# Patient Record
Sex: Male | Born: 1975 | Race: White | Hispanic: No | Marital: Married | Smoking: Former smoker
Health system: Southern US, Community
[De-identification: ages and names within clinical notes are randomized; demographics above are authoritative.]

## PROBLEM LIST (undated history)

## (undated) DIAGNOSIS — N200 Calculus of kidney: Secondary | ICD-10-CM

## (undated) HISTORY — DX: Calculus of kidney: N20.0

---

## 2008-08-09 ENCOUNTER — Emergency Department (HOSPITAL_COMMUNITY): Admission: EM | Admit: 2008-08-09 | Discharge: 2008-08-10 | Payer: Self-pay | Admitting: Emergency Medicine

## 2009-08-14 ENCOUNTER — Ambulatory Visit: Payer: Self-pay | Admitting: Internal Medicine

## 2009-08-14 DIAGNOSIS — J069 Acute upper respiratory infection, unspecified: Secondary | ICD-10-CM | POA: Insufficient documentation

## 2009-10-24 IMAGING — US US SCROTUM
1 series · 13 of 25 positions shown · non-contrast
Comparison: None.

CLINICAL DATA: 33-year-old male with scrotal pain.  "Cyst ruptured
on top of left testicle."

SCROTAL ULTRASOUND
DOPPLER ULTRASOUND OF THE TESTICLES
TECHNIQUE: Complete ultrasound examination of the testicles,
epididymis, and other scrotal structures was performed.  Color and
spectral Doppler ultrasound were also utilized to evaluate blood
flow to the testicles.

[Series 1: unknown · 0.09mm/px · 13 of 64 slices shown]
[im 1/64]
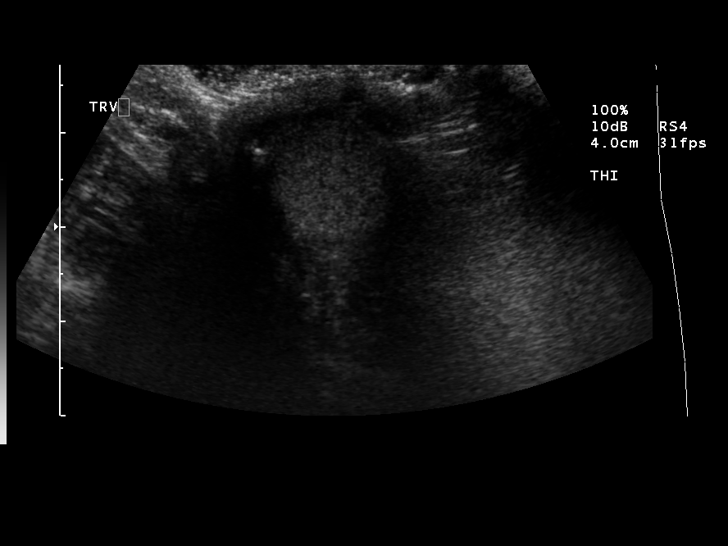
[im 6/64]
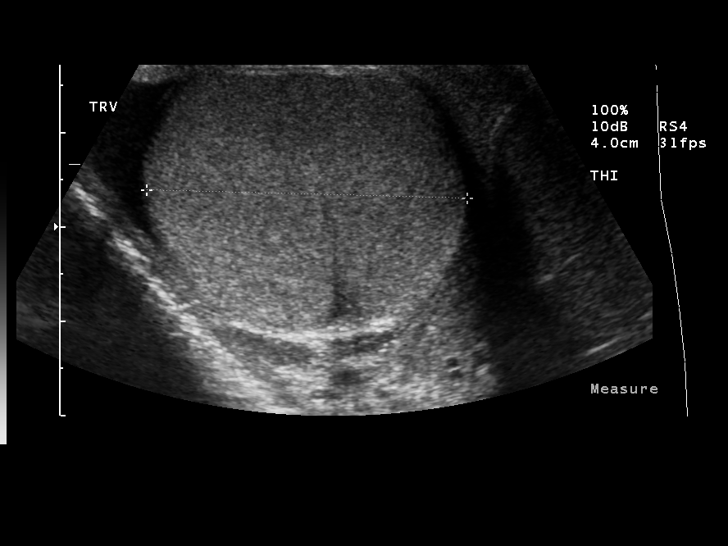
[im 11/64]
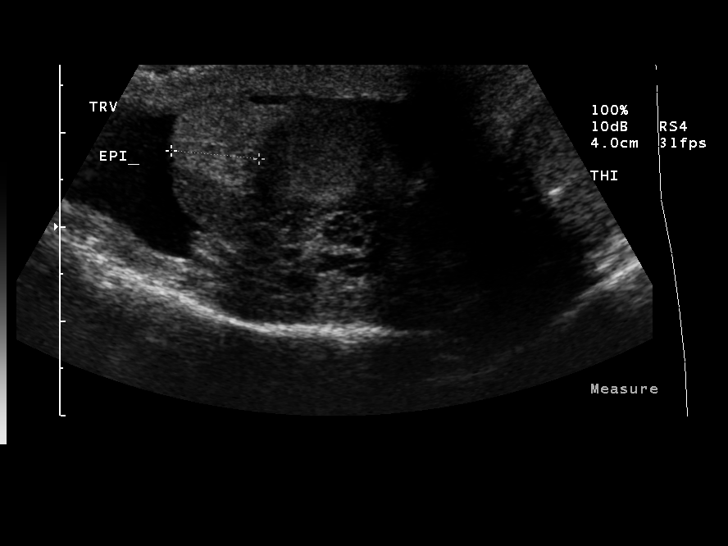
[im 16/64]
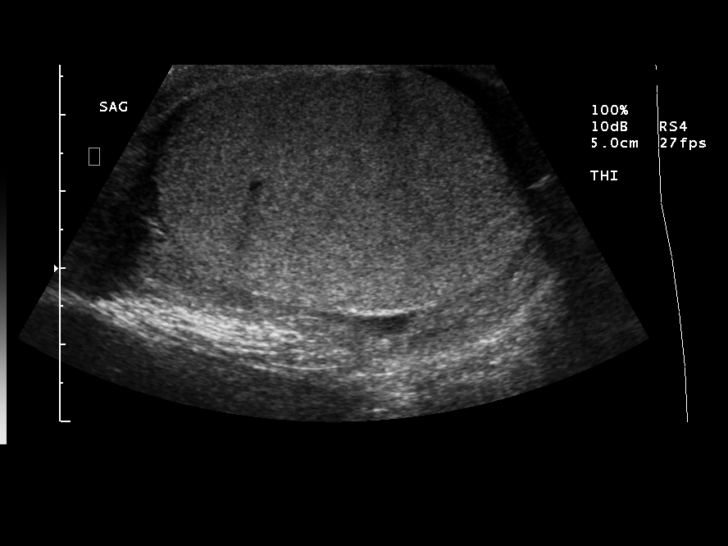
[im 22/64]
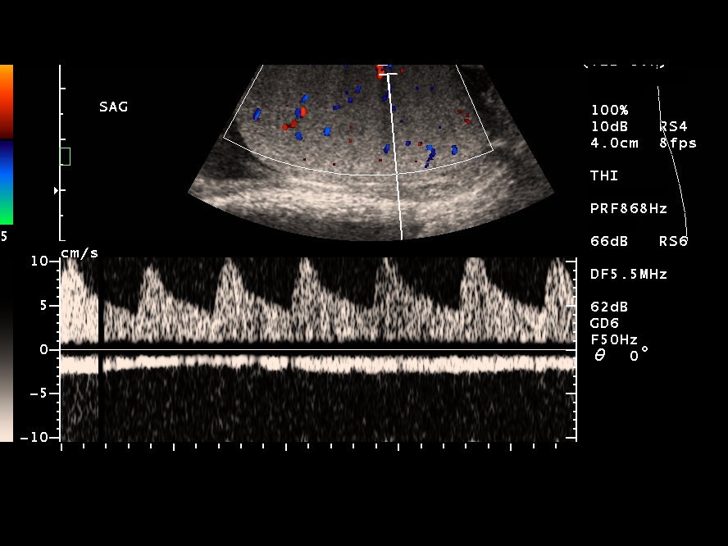
[im 27/64]
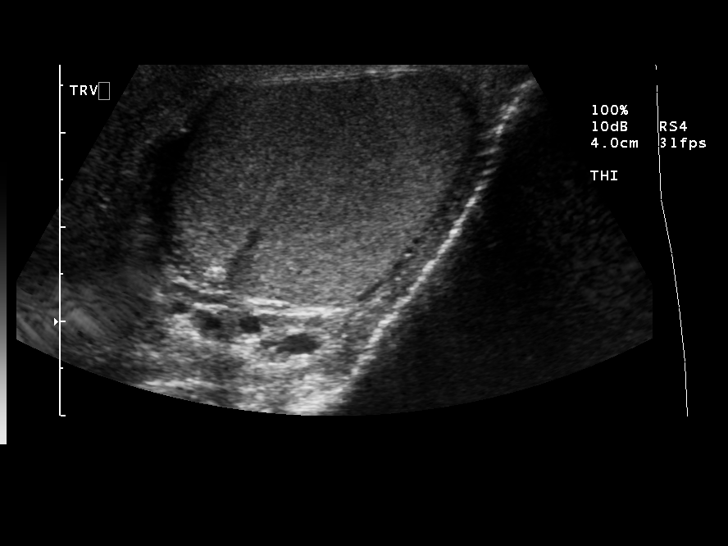
[im 32/64]
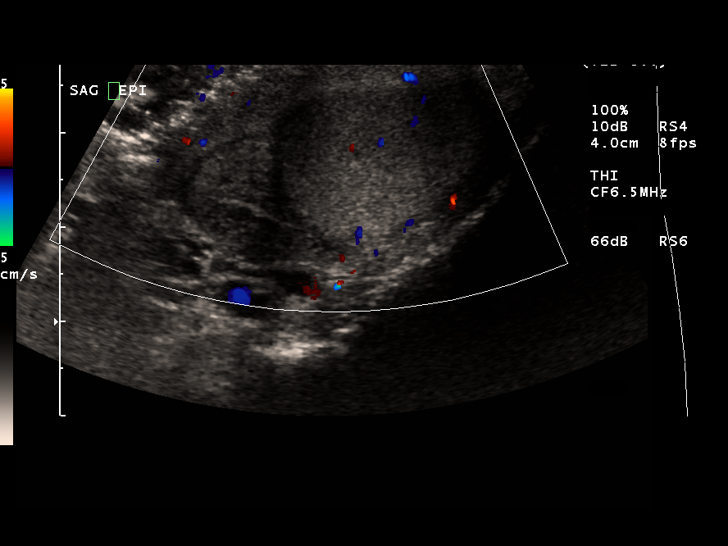
[im 37/64]
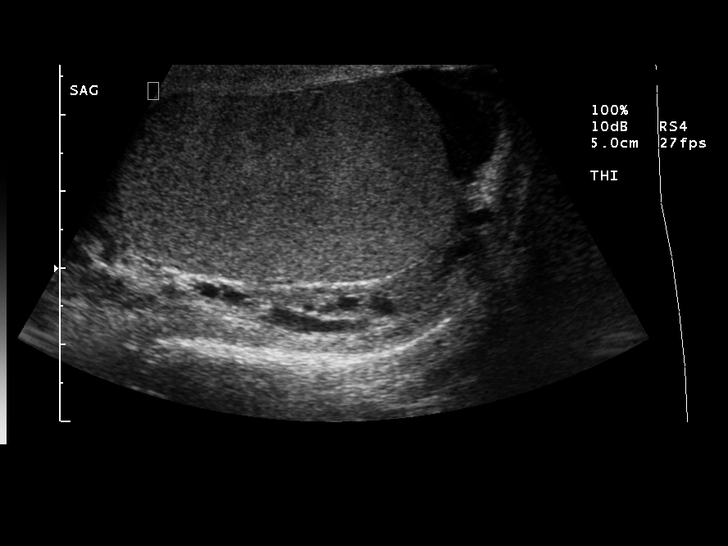
[im 43/64]
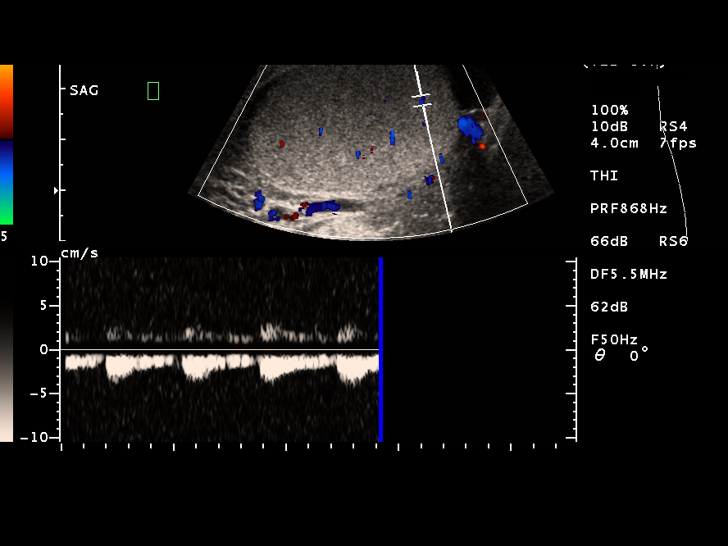
[im 48/64]
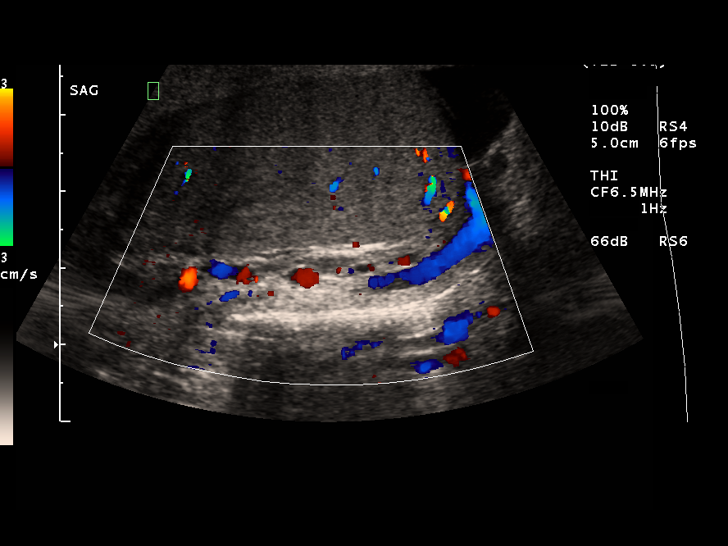
[im 53/64]
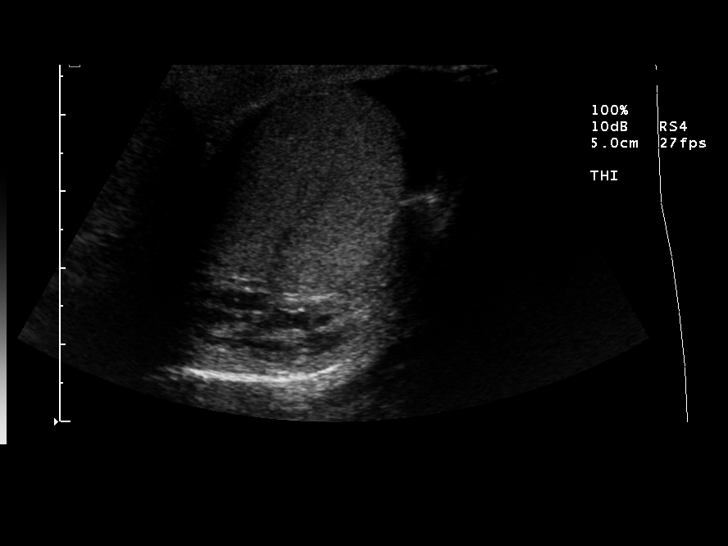
[im 58/64]
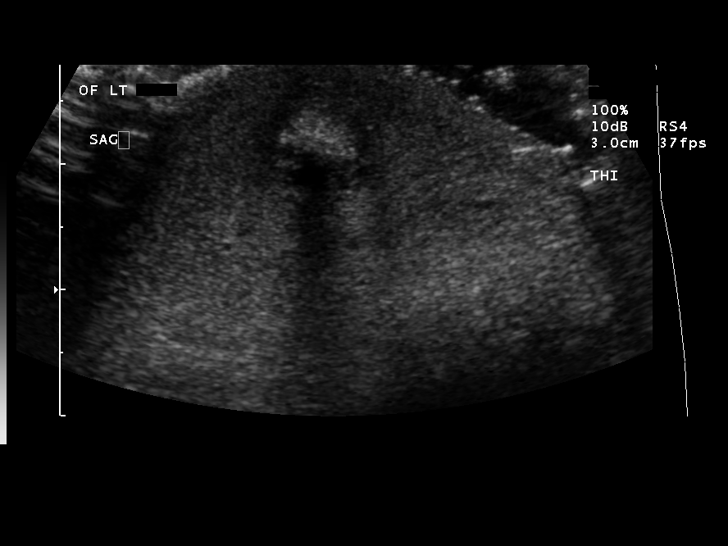
[im 64/64]
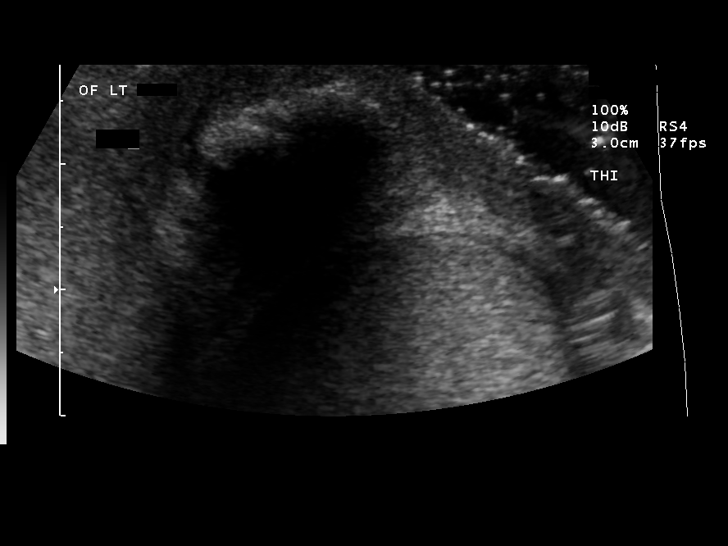

[13 of 25 positions shown; findings below may reference images not displayed]

FINDINGS: Right testicle has normal echotexture measures 5.2 x
x 3.4 cm.  The right epididymis is normal measuring 11 x 6 x 9 mm.
Trace hydrocele on the right appears simple.  Color Doppler reveals
normal vascularity in the right testicle with arterial and venous
waveforms detected.

The left testicle has normal echotexture and measures 4.7 x 2.5 x
2.9 cm.  The left epididymis is within normal limits measuring 8 x
13 x 9 mm.  Vascularity of the left testicle and epididymis are
symmetric to that on the right with arterial and venous waveforms
detected.  Trace hydrocele on the left, similar to that on the
right.

Superficial to the left testicle labeled "area of cyst on top" and
there is an area of dirty shadowing measuring 17 mm with
heterogeneously increased echotexture.  The appearance suggests a
gas or calcification.  This appears to be associated with the
scrotal wall which is thickened.
IMPRESSION: 1.  Normal testicles and no evidence of testicular torsion.
2. Left scrotal wall thickening with an area of increased
echogenicity and dirty shadowing measuring 17 mm labeled as "area
of cyst."  Recommend correlation with physical exam.

## 2010-08-17 NOTE — Assessment & Plan Note (Signed)
Summary: BRAND NEW PT/TO EST/FEVER/COUGH/BODY ACHES/CJR   Vital Signs:  Patient profile:   35 year old male Height:      69 inches Weight:      153 pounds BMI:     22.68 Temp:     98.5 degrees F oral BP sitting:   88 / 44  (left arm) Cuff size:   regular  Vitals Entered By: Raechel Ache, RN (August 14, 2009 1:21 PM) CC: New to establish. C/o fever, sore throat, aches and skin hurts, productive cough, head congestion and diarrhea.   CC:  New to establish. C/o fever, sore throat, aches and skin hurts, productive cough, and head congestion and diarrhea..  History of Present Illness: 35 year old patient who is seen today to establish with our practice.  He presents with a 3-day history of fever, chills, myalgias, and slightly productive cough.  His has had head  congestion, mild sore throat, and some episodic diarrhea.  denies any chest pain or shortness of breath.  His past medical history is unremarkable.  He is seen today to establish  with our practice and for a preventative exam  Preventive Screening-Counseling & Management  Caffeine-Diet-Exercise     Does Patient Exercise: yes  Allergies (verified): No Known Drug Allergies  Past History:  Past Medical History: unremarkable  Past Surgical History: outpatient treatment of a fracture to the right hand (, fourth and fifth metacarpals)  Family History: Reviewed history and no changes required.  details is father's health unknown mother, age 59 in excellent health  No siblings  Social History: Reviewed history and no changes required. 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  Son, age 69 Regular exercise-yes health club 3 times weeklyDoes Patient Exercise:  yes  Review of Systems       The patient complains of anorexia, fever, and prolonged cough.  The patient denies weight loss, weight gain, vision loss, decreased hearing, hoarseness, chest pain, syncope, dyspnea on  exertion, peripheral edema, headaches, hemoptysis, abdominal pain, melena, hematochezia, severe indigestion/heartburn, hematuria, incontinence, genital sores, muscle weakness, suspicious skin lesions, transient blindness, difficulty walking, depression, unusual weight change, abnormal bleeding, enlarged lymph nodes, angioedema, breast masses, and testicular masses.    Physical Exam  General:  Well-developed,well-nourished,in no acute distress; alert,appropriate and cooperative throughout examination Head:  Normocephalic and atraumatic without obvious abnormalities. No apparent alopecia or balding. Eyes:  No corneal or conjunctival inflammation noted. EOMI. Perrla. Funduscopic exam benign, without hemorrhages, exudates or papilledema. Vision grossly normal. Ears:  External ear exam shows no significant lesions or deformities.  Otoscopic examination reveals clear canals, tympanic membranes are intact bilaterally without bulging, retraction, inflammation or discharge. Hearing is grossly normal bilaterally. Nose:  External nasal examination shows no deformity or inflammation. Nasal mucosa are pink and moist without lesions or exudates. Mouth:  pharyngeal erythema.   Neck:  No deformities, masses, or tenderness noted. Chest Wall:  No deformities, masses, tenderness or gynecomastia noted. Breasts:  No masses or gynecomastia noted Lungs:  Normal respiratory effort, chest expands symmetrically. Lungs are clear to auscultation, no crackles or wheezes. Heart:  Normal rate and regular rhythm. S1 and S2 normal without gallop, murmur, click, rub or other extra sounds. Abdomen:  Bowel sounds positive,abdomen soft and non-tender without masses, organomegaly or hernias noted. Genitalia:  Testes bilaterally descended without nodularity, tenderness or masses. No scrotal masses or lesions. No penis lesions or urethral discharge. Msk:  No deformity or scoliosis noted of thoracic or lumbar spine.  Pulses:  R and L  carotid,radial,femoral,dorsalis pedis and posterior tibial pulses are full and equal bilaterally Extremities:  No clubbing, cyanosis, edema, or deformity noted with normal full range of motion of all joints.   Neurologic:  alert & oriented X3, sensation intact to light touch, sensation intact to pinprick, and gait normal.   Skin:  Intact without suspicious lesions or rashes Cervical Nodes:  No lymphadenopathy noted Axillary Nodes:  No palpable lymphadenopathy Inguinal Nodes:  No significant adenopathy Psych:  Cognition and judgment appear intact. Alert and cooperative with normal attention span and concentration. No apparent delusions, illusions, hallucinations   Impression & Recommendations:  Problem # 1:  URI (ICD-465.9)  His updated medication list for this problem includes:    Hydrocodone-homatropine 5-1.5 Mg/63ml Syrp (Hydrocodone-homatropine) .Marland Kitchen... 1 teaspoon every 6 hours as needed for cough  Problem # 2:  Preventive Health Care (ICD-V70.0)  Complete Medication List: 1)  Hydrocodone-homatropine 5-1.5 Mg/47ml Syrp (Hydrocodone-homatropine) .Marland Kitchen.. 1 teaspoon every 6 hours as needed for cough  Patient Instructions: 1)  Get plenty of rest, drink lots of clear liquids, and use Tylenol or Ibuprofen for fever and comfort. Return in 7-10 days if you're not better:sooner if you're feeling worse. Prescriptions: HYDROCODONE-HOMATROPINE 5-1.5 MG/5ML SYRP (HYDROCODONE-HOMATROPINE) 1 teaspoon every 6 hours as needed for cough  #6 oz x 0   Entered and Authorized by:   Gordy Savers  MD   Signed by:   Gordy Savers  MD on 08/14/2009   Method used:   Print then Give to Patient   RxID:   [id number]

## 2010-08-17 NOTE — Letter (Signed)
Summary: Out of Work  Adult nurse at [employer]  [address]   [place], [place] [address]   Phone: [phone]  Fax: [phone]    August 14, 2009   Employee:  DAMEIAN CRISMAN    To Whom It May Concern:   For Medical reasons, please excuse the above named employee from work for the following dates:  Start:   08-14-2009  End:   08-15-2009  If you need additional information, please feel free to contact our office.         Sincerely,    Gordy Savers  MD

## 2010-11-01 LAB — URINALYSIS, ROUTINE W REFLEX MICROSCOPIC
Glucose, UA: NEGATIVE mg/dL
Hgb urine dipstick: NEGATIVE
Ketones, ur: NEGATIVE mg/dL
Protein, ur: NEGATIVE mg/dL

## 2012-01-02 ENCOUNTER — Ambulatory Visit (INDEPENDENT_AMBULATORY_CARE_PROVIDER_SITE_OTHER): Payer: BC Managed Care – PPO | Admitting: Internal Medicine

## 2012-01-02 ENCOUNTER — Encounter: Payer: Self-pay | Admitting: Internal Medicine

## 2012-01-02 VITALS — BP 120/80 | HR 64 | Temp 97.9°F | Ht 69.0 in | Wt 154.0 lb

## 2012-01-02 DIAGNOSIS — M722 Plantar fascial fibromatosis: Secondary | ICD-10-CM

## 2012-01-02 DIAGNOSIS — M7711 Lateral epicondylitis, right elbow: Secondary | ICD-10-CM

## 2012-01-02 DIAGNOSIS — M7710 Lateral epicondylitis, unspecified elbow: Secondary | ICD-10-CM

## 2012-01-02 NOTE — Assessment & Plan Note (Signed)
Patient experiencing progressively worsening bilateral foot pain consistent with plantar fasciitis. Reviewed stretching exercises and use of shoe inserts. Refer to podiatrist for further evaluation and treatment.

## 2012-01-02 NOTE — Assessment & Plan Note (Signed)
Patient works as Games developer. His symptoms consistent with lateral epicondylitis. Patient advised to avoid heavy lifting/gripping for next 1 to 2 weeks. Use ice compress twice per day and forearm brace. If persistent symptoms, we discussed possibly using cortisone injection.

## 2012-01-02 NOTE — Patient Instructions (Addendum)
Use right forearm brace No heavy lifting or gripping for 1-2 weeks Use ibuprofen 600 mg every 12 hrs as needed for 1-2 weeks Our office will contact you re: podiatry referral

## 2012-01-02 NOTE — Progress Notes (Signed)
  Subjective:    Patient ID: Brandon Gibbs, male    DOB: [birth year], 36 y.o.   MRN: [id number]  HPI  36 year old white male to establish. Patient complains of chronic right forearm pain for the last 2 months. He denies injury or trauma. 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 His symptoms are worse with gripping heavy objects and wrist extension.  He has been self treating with ibuprofen daily.  Patient also complains of chronic bilateral foot pain. He has pain at inside of his heel and towards the middle of his foot. Patient wears fairly comfortable shoes due to the fact that he is on his feet most of the day. He also works as a Engineer, water.  Bilateral foot pain is progressively worsening.   Review of Systems   Constitutional: Negative for activity change, appetite change and unexpected weight change.  Eyes: Negative for visual disturbance.  Respiratory: Negative for cough, chest tightness and shortness of breath.   Cardiovascular: Negative for chest pain.  Genitourinary: Negative for difficulty urinating.  Neurological: Negative for headaches.  Gastrointestinal: Negative for abdominal pain, heartburn melena or hematochezia Psych: Negative for depression or anxiety  Past Medical History  Diagnosis Date  . Kidney stones     History   Social History  . Marital Status: Married    Spouse Name: N/A    Number of Children: N/A  . Years of Education: N/A   Occupational History  . diesel Curator [employer]   Social History Main Topics  . Smoking status: Former Smoker    Types: Cigarettes    Quit date: 01/01/2006  . Smokeless tobacco: Not on file  . Alcohol Use: Yes  . Drug Use: No  . Sexually Active: Not on file   Other Topics Concern  . 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 He has 2 children daughter 42 and son 33    No past surgical history on file.  No family history on  file.  Allergies  Allergen Reactions  . Penicillins     No current outpatient prescriptions on file prior to visit.    BP 120/80  Pulse 64  Temp 97.9 F (36.6 C) (Oral)  Ht 5\' 9"  (1.753 m)  Wt 154 lb (69.854 kg)  BMI 22.74 kg/m2       Objective:   Physical Exam  Constitutional: He appears well-developed and well-nourished.  HENT:  Head: Normocephalic and atraumatic.  Right Ear: External ear normal.  Left Ear: External ear normal.  Mouth/Throat: Oropharynx is clear and moist.  Eyes: Conjunctivae and EOM are normal. Pupils are equal, round, and reactive to light.  Neck: Neck supple. Thyromegaly present.  Cardiovascular: Normal rate, regular rhythm and normal heart sounds.   No murmur heard. Pulmonary/Chest: Effort normal and breath sounds normal. He has no wheezes.  Abdominal: Soft. Bowel sounds are normal. He exhibits no mass. There is no tenderness.  Musculoskeletal:       Tenderness of right lateral epicondyle area Bilateral heel tenderness  Lymphadenopathy:    He has no cervical adenopathy.  Skin: Skin is warm and dry.  Psychiatric: He has a normal mood and affect.       Assessment & Plan:

## 2012-07-10 ENCOUNTER — Encounter: Payer: Self-pay | Admitting: Family

## 2012-07-10 ENCOUNTER — Ambulatory Visit (INDEPENDENT_AMBULATORY_CARE_PROVIDER_SITE_OTHER): Payer: BC Managed Care – PPO | Admitting: Family

## 2012-07-10 VITALS — BP 132/88 | Temp 98.2°F | Wt 156.0 lb

## 2012-07-10 DIAGNOSIS — Z7251 High risk heterosexual behavior: Secondary | ICD-10-CM

## 2012-07-10 DIAGNOSIS — R300 Dysuria: Secondary | ICD-10-CM

## 2012-07-10 LAB — POCT URINALYSIS DIPSTICK
Blood, UA: NEGATIVE
Ketones, UA: NEGATIVE
pH, UA: 6.5

## 2012-07-10 LAB — HIV ANTIBODY (ROUTINE TESTING W REFLEX): HIV: NONREACTIVE

## 2012-07-10 MED ORDER — AZITHROMYCIN 500 MG PO TABS: 1000.0000 mg | ORAL_TABLET | Freq: Every day | ORAL | Status: DC

## 2012-07-10 MED ORDER — CIPROFLOXACIN HCL 500 MG PO TABS: 500.0000 mg | ORAL_TABLET | Freq: Two times a day (BID) | ORAL | Status: DC

## 2012-07-10 NOTE — Patient Instructions (Signed)
Dysuria  Dysuria is the medical term for pain with urination. There are many causes for dysuria, but urinary tract infection is the most common. If a urinalysis was performed it can show that there is a urinary tract infection. A urine culture confirms that you or your child is sick. You will need to follow up with a healthcare provider because:  · If a urine culture was done you will need to know the culture results and treatment recommendations.  · If the urine culture was positive, you or your child will need to be put on antibiotics or know if the antibiotics prescribed are the right antibiotics for your urinary tract infection.  · If the urine culture is negative (no urinary tract infection), then other causes may need to be explored or antibiotics need to be stopped.  Today laboratory work may have been done and there does not seem to be an infection. If cultures were done they will take at least 24 to 48 hours to be completed.  Today x-rays may have been taken and they read as normal. No cause can be found for the problems. The x-rays may be re-read by a radiologist and you will be contacted if additional findings are made.  You or your child may have been put on medications to help with this problem until you can see your primary caregiver. If the problems get better, see your primary caregiver if the problems return. If you were given antibiotics (medications which kill germs), take all of the mediations as directed for the full course of treatment.   If laboratory work was done, you need to find the results. Leave a telephone number where you can be reached. If this is not possible, make sure you find out how you are to get test results.  HOME CARE INSTRUCTIONS   · Drink lots of fluids. For adults, drink eight, 8 ounce glasses of clear juice or water a day. For children, replace fluids as suggested by your caregiver.  · Empty the bladder often. Avoid holding urine for long periods of time.  · After a bowel  movement, women should cleanse front to back, using each tissue only once.  · Empty your bladder before and after sexual intercourse.  · Take all the medicine given to you until it is gone. You may feel better in a few days, but TAKE ALL MEDICINE.  · Avoid caffeine, tea, alcohol and carbonated beverages, because they tend to irritate the bladder.  · In men, alcohol may irritate the prostate.  · Only take over-the-counter or prescription medicines for pain, discomfort, or fever as directed by your caregiver.  · If your caregiver has given you a follow-up appointment, it is very important to keep that appointment. Not keeping the appointment could result in a chronic or permanent injury, pain, and disability. If there is any problem keeping the appointment, you must call back to this facility for assistance.  SEEK IMMEDIATE MEDICAL CARE IF:   · Back pain develops.  · A fever develops.  · There is nausea (feeling sick to your stomach) or vomiting (throwing up).  · Problems are no better with medications or are getting worse.  MAKE SURE YOU:   · Understand these instructions.  · Will watch your condition.  · Will get help right away if you are not doing well or get worse.  Document Released: 04/01/2004 Document Revised: 09/26/2011 Document Reviewed: 02/07/2008  ExitCare® Patient Information ©2013 [employer].

## 2012-07-10 NOTE — Progress Notes (Signed)
  Subjective:    Patient ID: Brandon Gibbs, male    DOB: Nov 13, 1975, 36 y.o.   MRN: [id number]  HPI 36 year old white male, patient of Dr. Artist Pais is in today with complaints of painful urination x2 days. He is sexually active approximately 5 current sexual partner, both protected and a protective. Denies any discharge from his penis. Denies any abdominal pain or back pain. No fever or chills. Reports a new sex partner this past week and that was unprotected.   Review of Systems  Constitutional: Negative.   Respiratory: Negative.   Cardiovascular: Negative.   Gastrointestinal: Negative.   Genitourinary: Positive for dysuria and penile pain. Negative for hematuria, discharge and testicular pain.  Musculoskeletal: Negative.   Skin: Negative.   Psychiatric/Behavioral: Negative.    Past Medical History  Diagnosis Date  . Kidney stones     History   Social History  . Marital Status: Married    Spouse Name: N/A    Number of Children: N/A  . Years of Education: N/A   Occupational History  . diesel Curator [employer]   Social History Main Topics  . Smoking status: Former Smoker    Types: Cigarettes    Quit date: 01/01/2006  . Smokeless tobacco: Not on file  . Alcohol Use: Yes  . Drug Use: No  . Sexually Active: Not on file   Other Topics Concern  . 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  No family history on file.  Allergies  Allergen Reactions  . Penicillins     No current outpatient prescriptions on file prior to visit.    BP 132/88  Temp 98.2 F (36.8 C) (Oral)  Wt 156 lb (70.761 kg)chart    Objective:   Physical Exam  Constitutional: He is oriented to person, place, and time. He appears well-developed and well-nourished.  Neck: Normal range of motion. Neck supple.  Cardiovascular: Normal rate, regular  rhythm and normal heart sounds.   Pulmonary/Chest: Effort normal and breath sounds normal.  Abdominal: Soft. Bowel sounds are normal.  Genitourinary: Penile tenderness present.       Small amount of yellow discharge from the penis. No LA.   Musculoskeletal: Normal range of motion.  Neurological: He is alert and oriented to person, place, and time.  Skin: Skin is warm and dry.  Psychiatric: He has a normal mood and affect.          Assessment & Plan:  Assessment: Dysuria, high-risk sexual behavior  Plan: We'll cover appears clean with azithromycin 1 g by mouth x1. Cipro 500 mg twice a day times one day. Abstain from sexual intercourse x1 week. In the future, protected sex. Additional lab sent to include HIV, HSV, RPR will notify patient pending results. GC chlamydia sent.

## 2012-07-10 NOTE — Addendum Note (Signed)
Addended by: Bonnye Fava on: 07/10/2012 01:25 PM   Modules accepted: Orders

## 2012-07-11 LAB — RPR

## 2012-07-12 LAB — GC/CHLAMYDIA PROBE AMP
CT Probe RNA: NEGATIVE
GC Probe RNA: NEGATIVE

## 2013-05-29 ENCOUNTER — Encounter: Payer: Self-pay | Admitting: Internal Medicine

## 2013-05-29 ENCOUNTER — Ambulatory Visit (INDEPENDENT_AMBULATORY_CARE_PROVIDER_SITE_OTHER): Payer: BC Managed Care – PPO | Admitting: Internal Medicine

## 2013-05-29 VITALS — BP 120/80 | HR 67 | Temp 97.9°F | Resp 20 | Wt 173.0 lb

## 2013-05-29 DIAGNOSIS — R369 Urethral discharge, unspecified: Secondary | ICD-10-CM

## 2013-05-29 DIAGNOSIS — Z23 Encounter for immunization: Secondary | ICD-10-CM

## 2013-05-29 MED ORDER — AZITHROMYCIN 250 MG PO TABS: ORAL_TABLET | ORAL | Status: DC

## 2013-05-29 MED ORDER — CEFTRIAXONE SODIUM 1 G IJ SOLR
250.0000 mg | Freq: Once | INTRAMUSCULAR | Status: AC
  Administered 2013-05-29: 250 mg via INTRAMUSCULAR

## 2013-05-29 NOTE — Progress Notes (Signed)
  Subjective:    Patient ID: Brandon Gibbs, male    DOB: [birth year], 37 y.o.   MRN: [id number]  HPI Pre-visit discussion using our clinic review tool. No additional management support is needed unless otherwise documented below in the visit note.  37 year old patient who presents with a two-day history of burning dysuria and urethral discharge. He is sexually active with multiple partners. He states that he uses condoms for vaginal intercourse but not for oral sex. No history of documented STD in the past although he had similar symptoms 11 months ago and had a negative STD screen. He presented with similar complaints and was treated with Cipro and azithromycin  Past Medical History  Diagnosis Date  . Kidney stones     History   Social History  . Marital Status: Married    Spouse Name: N/A    Number of Children: N/A  . Years of Education: N/A   Occupational History  . diesel Curator [employer]   Social History Main Topics  . Smoking status: Former Smoker    Types: Cigarettes    Quit date: 01/01/2006  . Smokeless tobacco: Not on file  . Alcohol Use: Yes  . Drug Use: No  . Sexual Activity: Not on file   Other Topics Concern  . 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 He has 2 children daughter 52 and son 36    History reviewed. No pertinent past surgical history.  No family history on file.  Allergies  Allergen Reactions  . Penicillins     No current outpatient prescriptions on file prior to visit.   No current facility-administered medications on file prior to visit.    BP 120/80  Pulse 67  Temp(Src) 97.9 F (36.6 C) (Oral)  Resp 20  Wt 173 lb (78.472 kg)  SpO2 98%       Review of Systems  Genitourinary: Positive for dysuria and discharge.       Objective:   Physical Exam  Constitutional: He appears well-developed and well-nourished. No distress.  Genitourinary: Penis  normal.          Assessment & Plan:   Urethral discharge.  We'll screen for GC Chlamydia and HIV. We'll treat empirically with Rocephin 250 mg IM and azithromycin 1 g by mouth  STD information provided

## 2013-05-29 NOTE — Patient Instructions (Signed)
Gonorrhea, Females and Males  Gonorrhea is an infection. Gonorrhea can be treated with medicines that kill germs (antibiotics). It is necessary that all your sexual partners also be tested for infection and possibly be treated.   CAUSES   Gonorrhea is caused by a germ (bacteria) called Neisseria gonorrhoeae. This infection is spread by sexual contact. The contact that spreads gonorrhea from person to person may be oral, anal, or genital sex.  SYMPTOMS   Females  A woman may have gonorrhea infection and no symptoms. The most common symptoms are:   Pain in the lower abdomen.   Fever, with or without chills.  When these are the most serious problems, the illness is commonly called pelvic inflammatory disease (PID). Other symptoms include:   Abnormal vaginal discharge.   Painful intercourse.   Burning or itching of the vagina or lips of the vagina.   Abnormal vaginal bleeding.   Pain when urinating.  If the infection is spread by anal sex:   Irritation, pain, bleeding, or discharge from the rectum.  If the infection is spread by oral sex with either a man or a woman:   Sore throat, fever, and swollen neck lymph glands.  Other problems may include:   Long-lasting (chronic) pain in the lower abdomen during menstruation, intercourse, or at other times.   Inability to become pregnant.   Premature birth.   Passing the infection onto a newborn baby. This can cause an eye infection in the infant or more serious health problems.  Males  Less frequently than in women, men may have gonorrhea infection and no symptoms. The most common symptoms are:   Discharge from the penis.   Pain or burning during urination.  If the infection is spread by anal sex:   Irritation, pain, bleeding, or discharge from the rectum.  If the infection is spread by oral sex with either a man or a woman:   Sore throat, fever, and swollen neck lymph glands.  DIAGNOSIS   Diagnosis is made by exam of the patient and checking a sample of  discharge under a microscope for the presence of the bacteria. Discharge may be taken from the urethra, cervix, throat, or rectum.  TREATMENT   It is important to diagnose and treat gonorrhea as soon as possible. This prevents damage to the male or male organs or harm to the newborn baby of an infected woman.   Antibiotics are used to treat gonorrhea.   Your sex partners should also be examined and treated if needed.   Testing and treatment for other sexually transmitted diseases (STDs) may be done when you are diagnosed with gonorrhea. Gonorrhea is an STD. You are at risk for other STDs, which are often transmitted around the same time as gonorrhea. These include:   Chlamydia.   Syphilis.   Trichomonas.   Human papillomavirus (HPV).   Human immunodeficiency virus (HIV).   If left untreated, PID can cause women to be unable to have children (sterile). To prevent sterility in females, it is important to be treated as soon as possible and finish all medicines. Unfortunately, sterility or pregnancy occurring outside the uterus (ectopic) may still occur in fully treated women.  HOME CARE INSTRUCTIONS    Finish all medicine as prescribed. Incomplete treatment will put you at risk for continued infection.   Only take over-the-counter or prescription medicines for pain, discomfort, or fever as directed by your caregiver.   Do not have sex until treatment is completed, or as instructed   out the results of your test Not all test results are available during your visit. If your test results are not back during the visit, make an appointment with your caregiver to find out the results. Do not assume everything is normal if you have not  heard from your caregiver or the medical facility. It is important for you to follow up on all of your test results. SEEK MEDICAL CARE IF:   You develop any bad reaction to the medicine you were prescribed. This may include:  Rash.  Nausea.  Vomiting.  Diarrhea.  You have an oral temperature above 102 F (38.9 C).  You have symptoms that do not improve, symptoms that get worse, or you develop increased pain. Males may get pain in the testicles and females may get increased abdominal pain. MAKE SURE YOU:   Understand these instructions.  Will watch your condition.  Will get help right away if you are not doing well or get worse. Document Released: 07/01/2000 Document Revised: 09/26/2011 Document Reviewed: 01/09/2013 [facility] Patient Information 2014 [place], [place]. Chlamydia, Females and Males Chlamydia is an infection that can be found in the vagina, urethra, cervix, rectum and pelvic organs in the male. In the male, it most often causes urethritis. This happens when it infects the tube (urethra) that carries the urine out of the bladder. When Chlamydia causes urethritis, there may be burning with urination. In males, it may also infect the tubes that carry the sperm from the testicle. This causes pain in the testicles and infect the prostate gland. In females, an infection of the pelvic organs is also called PID (pelvic inflammatory disease). PID may be a cause of sudden (acute) lower abdominal/belly (pelvic) pain and fever. But with Chlamydia, the infection sometimes does not cause problems that you notice (asymptomatic). It may cause an abnormal or watery mucous-like discharge from the birth canal (vagina) or penis.  CAUSES  Chlamydia is caused by germs (bacteria) that are spread during sexual contact of the:  Genitals.  Mouth.  Rectum. This infection may also be passed to a newborn baby coming through the infected birth canal. This causes eye and lung infections in the  baby. Chlamydia often goes unnoticed. So it is easy to transmit it to a sexual partner without even knowing. SYMPTOMS  In females, symptoms may go unnoticed. Symptoms that are more noticeable can include:  Belly (abdominal) pain.  Painful intercourse.  Watery mucous-like discharge from the vagina.  Miscarriage.  Discomfort when urinating.  Inflammation of the rectum. In males, symptoms include:  Burning with urination.  Pain in the testicles.  Watery mucous-like discharge from the penis. It can cause longstanding (chronic) pelvic pain after frequent infections. TREATMENT   Chlamydia can be treated with medications which kill germs(antibiotics).  Inform all sexual partners about the infection. All sexual contacts need to be treated.  If you are pregnant, do not take tetracycline type antibiotics.  PID can cause women to not be able to have children (sterile) if left untreated or if half-treated. It does this by scarring the tubes to the uterus (fallopian tubes). They carry the egg needed to form a baby. It is important to finish ALL medications given to you.  Sterility or future tubal (ectopic) pregnancies can occur in fully treated individuals. It is important to follow your prescribed treatment. That will lessen the chances of these problems.  This is a sexually transmitted infection. So you are also at risk for other sexually transmitted diseases. These include: Gonorrhea  and HIV (AIDS). Testing may be done for the other sexually transmitted diseases if one disease is detected.  It is important to treat chlamydia as soon as possible. It can cause damage to other organs. HOME CARE INSTRUCTIONS  Finish all medication as prescribed. Incomplete treatment will put you at risk for not being able to have children (sterility) and tubal pregnancy. If one sexually transmitted disease is discovered, often treatment will be started to cover other possible infections.  Only take  over-the-counter or prescription medicines for pain, discomfort, or fever as directed by your caregiver.  Rest.  Eat a balanced diet and drink plenty of fluids.  Warning: This infection is contagious. Do not have sex until treatment is completed. Follow up at your caregiver's office or the clinic to which you were referred. If your diagnosis (learning what is wrong) is confirmed by culture or some other method, your recent sexual contacts need treatment. Even if they are symptom free or have a negative culture or evaluation, they should be treated.  For the protection of your privacy, test results can not be given over the phone. Make sure you receive the results of your test. Ask how these results are to be obtained if you have not been informed. It is your responsibility to obtain your test results. PREVENTION   Women should use sanitary pads instead of tampons for vaginal discharge.  Wipe front to back after using the toilet and avoid douching.  Test for chlamydia if you are having an IUD inserted.  Practice safe sex, use condoms, have only one sex partner and be sure your sex partner is not having sex with others.  Ask your caregiver to test you for chlamydia at your regular checkups or sooner if you are having symptoms.  Ask for further information if you are pregnant. SEEK IMMEDIATE MEDICAL CARE IF:   You develop an oral temperature above 102 F (38.9 C), not controlled by medications or lasting more than 2 days.  You develop an increase in pain.  You develop any type of abnormal discharge.  You develop vaginal bleeding and it is not time for your period.  You develop painful intercourse. MAKE SURE YOU:   Understand these instructions.  Will watch your condition.  Will get help right away if you are not doing well or get worse. Document Released: 07/04/2005 Document Revised: 09/26/2011 Document Reviewed: 01/10/2013 [facility] Patient Information 2014 [place],  [place]. Herpes Simplex Herpes simplex is generally classified as Type 1 or Type 2. Type 1 is generally the type that is responsible for cold sores. Type 2 is generally associated with sexually transmitted diseases. We now know that most of the thoughts on these viruses are inaccurate. We find that HSV1 is also present genitally and HSV2 can be present orally, but this will vary in different locations of the world. Herpes simplex is usually detected by doing a culture. Blood tests are also available for this virus; however, the accuracy is often not as good.  PREPARATION FOR TEST No preparation or fasting is necessary. NORMAL FINDINGS  No virus present  No HSV antigens or antibodies present Ranges for normal findings may vary among different laboratories and hospitals. You should always check with your doctor after having lab work or other tests done to discuss the meaning of your test results and whether your values are considered within normal limits. MEANING OF TEST  Your caregiver will go over the test results with you and discuss the importance and meaning of  your results, as well as treatment options and the need for additional tests if necessary. OBTAINING THE TEST RESULTS  It is your responsibility to obtain your test results. Ask the lab or department performing the test when and how you will get your results. Document Released: 08/06/2004 Document Revised: 09/26/2011 Document Reviewed: 06/14/2008 [facility] Patient Information 2014 [place], [place]. Human Papillomavirus Human papillomavirus (HPV) is the most common sexually transmitted infection (STI) and is highly contagious. HPV infections cause genital warts and cancers to the outlet of the womb (cervix), birth canal (vagina), opening of the birth canal (vulva), and anus. There are over 100 types of HPV. Four types of HPV are responsible for causing 70% of all cervical cancers. Ninety percent of anal cancers and genital warts are caused by  HPV. Unless you have wart-like lesions in the throat or genital warts that you can see or feel, HPV usually does not cause symptoms. Therefore, people can be infected for long periods and pass it on to others without knowing it. HPV in pregnancy usually does not cause a problem for the mother or baby. If the mother has genital warts, the baby rarely gets infected. When the HPV infection is found to be pre-cancerous on the cervix, vagina, or vulva, the mother will be followed closely during the pregnancy. Any needed treatment will be done after the baby is born. CAUSES   Having unprotected sex. HPV can be spread by oral, vaginal, or anal sexual activity.  Having several sex partners.  Having a sex partner who has other sex partners.  Having or having had another sexually transmitted infection. SYMPTOMS   More than 90% of people carrying HPV cannot tell anything is wrong.  Wart-like lesions in the throat (from having oral sex).  Warts in the infected skin or mucous membranes.  Genital warts may itch, burn, or bleed.  Genital warts may be painful or bleed during sexual intercourse. DIAGNOSIS   Genital warts are easily seen with the naked eye.  Currently, there is no FDA-approved test to detect HPV in males.  In females, a Pap test can show cells which are infected with HPV.  In females, a scope can be used to view the cervix (colposcopy). A colposcopy can be performed if the pelvic exam or Pap test is abnormal.  In females, a sample of tissue may be removed (biopsy) during the colposcopy. TREATMENT   Treatment of genital warts can include:  Podophyllin lotion or gel.  Bichloroacetic acid (BCA) or trichloroacetic acid (TCA).  Podofilox solution or gel.  Imiquimod cream.  Interferon injections.  Use of a probe to apply extreme cold (cryotherapy).  Application of an intense beam of light (laser treatment).  Use of a probe to apply extreme heat  (electrocautery).  Surgery.  HPV of the cervix, vagina, or vulva can be treated with:  Cryotherapy.  Laser treatment.  Electrocautery.  Surgery. Your caregiver will follow you closely after you are treated. This is because the HPV can come back and may need treatment again. HOME CARE INSTRUCTIONS   Follow your caregiver's instructions regarding medications, Pap tests, and follow-up exams.  Do not touch or scratch the warts.  Do not treat genital warts with medication used for treating hand warts.  Tell your sex partner about your infection because he or she may also need treatment.  Do not have sex while you are being treated.  After treatment, use condoms during sex to prevent future infections.  Have only 1 sex partner.  Have  a sex partner who does not have other sex partners.  Use over-the-counter creams for itching or irritation as directed by your caregiver.  Use over-the-counter or prescription medicines for pain, discomfort, or fever as directed by your caregiver.  Do not douche or use tampons during treatment of HPV. PREVENTION   Talk to your caregiver about getting the HPV vaccines. These vaccines prevent some HPV infections and cancers. It is recommended that the vaccine be given to males and females between the age of 27 and 16 years old. It will not work if you already have HPV and it is not recommended for pregnant women. The vaccines are not recommended for pregnant women.  Call your caregiver if you think you are pregnant and have the HPV.  A PAP test is done to screen for cervical cancer.  The first PAP test should be done at age 66.  Between ages 41 and 23, PAP tests are repeated every 2 years.  Beginning at age 54, you are advised to have a PAP test every 3 years as long as your past 3 PAP tests have been normal.  Some women have medical problems that increase the chance of getting cervical cancer. Talk to your caregiver about these problems. It is  especially important to talk to your caregiver if a new problem develops soon after your last PAP test. In these cases, your caregiver may recommend more frequent screening and Pap tests.  The above recommendations are the same for women who have or have not gotten the vaccine for HPV (Human Papillomavirus).  If you had a hysterectomy for a problem that was not a cancer or a condition that could lead to cancer, then you no longer need Pap tests. However, even if you no longer need a PAP test, a regular exam is a good idea to make sure no other problems are starting.   If you are between ages 47 and 70, and you have had normal Pap tests going back 10 years, you no longer need Pap tests. However, even if you no longer need a PAP test, a regular exam is a good idea to make sure no other problems are starting.  If you have had past treatment for cervical cancer or a condition that could lead to cancer, you need Pap tests and screening for cancer for at least 20 years after your treatment.  If Pap tests have been discontinued, risk factors (such as a new sexual partner)need to be re-assessed to determine if screening should be resumed.  Some women may need screenings more often if they are at high risk for cervical cancer. SEEK MEDICAL CARE IF:   The treated skin becomes red, swollen or painful.  You have an oral temperature above 102 F (38.9 C).  You feel generally ill.  You feel lumps or pimple-like projections in and around your genital area.  You develop bleeding of the vagina or the treatment area.  You develop painful sexual intercourse. Document Released: 09/24/2003 Document Revised: 09/26/2011 Document Reviewed: 09/13/2007 [facility] Patient Information 2014 [place], [place]. Sexually Transmitted Disease Sexually transmitted disease (STD) refers to any infection that is passed from person to person during sexual activity. This may happen by way of saliva, semen, blood, vaginal  mucus, or urine. Common STDs include:  Gonorrhea.  Chlamydia.  Syphilis.  HIV/AIDS.  Genital herpes.  Hepatitis B and C.  Trichomonas.  Human papillomavirus (HPV).  Pubic lice. CAUSES  An STD may be spread by bacteria, virus, or parasite.  A person can get an STD by:  Sexual intercourse with an infected person.  Sharing sex toys with an infected person.  Sharing needles with an infected person.  Having intimate contact with the genitals, mouth, or rectal areas of an infected person. SYMPTOMS  Some people may not have any symptoms, but they can still pass the infection to others. Different STDs have different symptoms. Symptoms include:  Painful or bloody urination.  Pain in the pelvis, abdomen, vagina, anus, throat, or eyes.  Skin rash, itching, irritation, growths, or sores (lesions). These usually occur in the genital or anal area.  Abnormal vaginal discharge.  Penile discharge in men.  Soft, flesh-colored skin growths in the genital or anal area.  Fever.  Pain or bleeding during sexual intercourse.  Swollen glands in the groin area.  Yellow skin and eyes (jaundice). This is seen with hepatitis. DIAGNOSIS  To make a diagnosis, your caregiver may:  Take a medical history.  Perform a physical exam.  Take a specimen (culture) to be examined.  Examine a sample of discharge under a microscope.  Perform blood tests.  Perform a Pap test, if this applies.  Perform a colposcopy.  Perform a laparoscopy. TREATMENT   Chlamydia, gonorrhea, trichomonas, and syphilis can be cured with antibiotic medicine.  Genital herpes, hepatitis, and HIV can be treated, but not cured, with prescribed medicines. The medicines will lessen the symptoms.  Genital warts from HPV can be treated with medicine or by freezing, burning (electrocautery), or surgery. Warts may come back.  HPV is a virus and cannot be cured with medicine or surgery.However, abnormal areas may be  followed very closely by your caregiver and may be removed from the cervix, vagina, or vulva through office procedures or surgery. If your diagnosis is confirmed, your recent sexual partners need treatment. This is true even if they are symptom-free or have a negative culture or evaluation. They should not have sex until their caregiver says it is okay. HOME CARE INSTRUCTIONS  All sexual partners should be informed, tested, and treated for all STDs.  Take your antibiotics as directed. Finish them even if you start to feel better.  Only take over-the-counter or prescription medicines for pain, discomfort, or fever as directed by your caregiver.  Rest.  Eat a balanced diet and drink enough fluids to keep your urine clear or pale yellow.  Do not have sex until treatment is completed and you have followed up with your caregiver. STDs should be checked after treatment.  Keep all follow-up appointments, Pap tests, and blood tests as directed by your caregiver.  Only use latex condoms and water-soluble lubricants during sexual activity. Do not use petroleum jelly or oils.  Avoid alcohol and illegal drugs.  Get vaccinated for HPV and hepatitis. If you have not received these vaccines in the past, talk to your caregiver about whether one or both might be right for you.  Avoid risky sex practices that can break the skin. The only way to avoid getting an STD is to avoid all sexual activity.Latex condoms and dental dams (for oral sex) will help lessen the risk of getting an STD, but will not completely eliminate the risk. SEEK MEDICAL CARE IF:   You have a fever.  You have any new or worsening symptoms. Document Released: 09/24/2002 Document Revised: 09/26/2011 Document Reviewed: 01/22/2013 [facility] Patient Information 2014 [place], [place]. Trichomoniasis Trichomoniasis is an infection, caused by the Trichomonas organism, that affects both women and men. In women, the outer male  genitalia  and the vagina are affected. In men, the penis is mainly affected, but the prostate and other reproductive organs can also be involved. Trichomoniasis is a sexually transmitted disease (STD) and is most often passed to another person through sexual contact. The majority of people who get trichomoniasis do so from a sexual encounter and are also at risk for other STDs. CAUSES   Sexual intercourse with an infected partner.  It can be present in swimming pools or hot tubs. SYMPTOMS   Abnormal gray-green frothy vaginal discharge in women.  Vaginal itching and irritation in women.  Itching and irritation of the area outside the vagina in women.  Penile discharge with or without pain in males.  Inflammation of the urethra (urethritis), causing painful urination.  Bleeding after sexual intercourse. RELATED COMPLICATIONS  Pelvic inflammatory disease.  Infection of the uterus (endometritis).  Infertility.  Tubal (ectopic) pregnancy.  It can be associated with other STDs, including gonorrhea and chlamydia, hepatitis B, and HIV. COMPLICATIONS DURING PREGNANCY  Early (premature) delivery.  Premature rupture of the membranes (PROM).  Low birth weight. DIAGNOSIS   Visualization of Trichomonas under the microscope from the vagina discharge.  Ph of the vagina greater than 4.5, tested with a test tape.  Trich Rapid Test.  Culture of the organism, but this is not usually needed.  It may be found on a Pap test.  Having a "strawberry cervix,"which means the cervix looks very red like a strawberry. TREATMENT   You may be given medication to fight the infection. Inform your caregiver if you could be or are pregnant. Some medications used to treat the infection should not be taken during pregnancy.  Over-the-counter medications or creams to decrease itching or irritation may be recommended.  Your sexual partner will need to be treated if infected. HOME CARE INSTRUCTIONS   Take all  medication prescribed by your caregiver.  Take over-the-counter medication for itching or irritation as directed by your caregiver.  Do not have sexual intercourse while you have the infection.  Do not douche or wear tampons.  Discuss your infection with your partner, as your partner may have acquired the infection from you. Or, your partner may have been the person who transmitted the infection to you.  Have your sex partner examined and treated if necessary.  Practice safe, informed, and protected sex.  See your caregiver for other STD testing. SEEK MEDICAL CARE IF:   You still have symptoms after you finish the medication.  You have an oral temperature above 102 F (38.9 C).  You develop belly (abdominal) pain.  You have pain when you urinate.  You have bleeding after sexual intercourse.  You develop a rash.  The medication makes you sick or makes you throw up (vomit). Document Released: 12/28/2000 Document Revised: 09/26/2011 Document Reviewed: 01/23/2009 [facility] Patient Information 2014 [place], [place].

## 2013-05-30 LAB — GC/CHLAMYDIA PROBE AMP, URINE: Chlamydia, Swab/Urine, PCR: NEGATIVE

## 2013-08-23 ENCOUNTER — Encounter: Payer: Self-pay | Admitting: Family Medicine

## 2013-08-23 ENCOUNTER — Ambulatory Visit (INDEPENDENT_AMBULATORY_CARE_PROVIDER_SITE_OTHER): Payer: BC Managed Care – PPO | Admitting: Family Medicine

## 2013-08-23 VITALS — BP 136/80 | HR 96 | Temp 98.4°F | Ht 69.0 in | Wt 165.0 lb

## 2013-08-23 DIAGNOSIS — H109 Unspecified conjunctivitis: Secondary | ICD-10-CM

## 2013-08-23 MED ORDER — DOXYCYCLINE HYCLATE 100 MG PO CAPS: 100.0000 mg | ORAL_CAPSULE | Freq: Two times a day (BID) | ORAL | Status: AC

## 2013-08-23 MED ORDER — NEOMYCIN-POLYMYXIN-HC 3.5-10000-1 OP SUSP: 4.0000 [drp] | Freq: Four times a day (QID) | OPHTHALMIC | Status: DC

## 2013-08-23 NOTE — Progress Notes (Signed)
   Subjective:    Patient ID: Brandon Gibbs, male    DOB: 12/17/1975, 38 y.o.   MRN: [id number]  HPI Here for 2 weeks of redness and burning in both eyes. They are matted shut every morning. He normally wears contact lenses but not lately. He has no URI sx otherwise. He went to Urgent Care on 08-17-13 and was given Tobramycin drops. These have not helped. He has had several sexual partners recently, and he had both oral and vaginal sex with one male about 2 and 1/2 weeks ago who has has since learned was treated for Chlamydia. He denies any urethritis sx.    Review of Systems  Constitutional: Negative.   HENT: Negative.   Eyes: Positive for discharge, redness and itching. Negative for photophobia.  Respiratory: Negative.   Genitourinary: Negative.        Objective:   Physical Exam  Constitutional: He appears well-developed and well-nourished.  HENT:  Right Ear: External ear normal.  Left Ear: External ear normal.  Nose: Nose normal.  Mouth/Throat: Oropharynx is clear and moist.  Eyes:  Both conjunctivae are red and swollen, corneas are clear   Lymphadenopathy:    He has no cervical adenopathy.          Assessment & Plan:  Conjunctivitis, probably either viral or due to Chlamydia. We will give him 10 days of Doxycycline to cover for Chlamydia exposure, also Cortisoporin drops to soothe the eyes.

## 2013-08-23 NOTE — Progress Notes (Signed)
Pre visit review using our clinic review tool, if applicable. No additional management support is needed unless otherwise documented below in the visit note. 

## 2013-12-20 ENCOUNTER — Ambulatory Visit (INDEPENDENT_AMBULATORY_CARE_PROVIDER_SITE_OTHER): Payer: BC Managed Care – PPO | Admitting: Internal Medicine

## 2013-12-20 ENCOUNTER — Encounter: Payer: Self-pay | Admitting: Internal Medicine

## 2013-12-20 VITALS — BP 110/76 | HR 72 | Temp 98.1°F | Ht 69.25 in | Wt 159.0 lb

## 2013-12-20 DIAGNOSIS — Z0000 Encounter for general adult medical examination without abnormal findings: Secondary | ICD-10-CM | POA: Insufficient documentation

## 2013-12-20 NOTE — Progress Notes (Signed)
Pre visit review using our clinic review tool, if applicable. No additional management support is needed unless otherwise documented below in the visit note. 

## 2013-12-20 NOTE — Progress Notes (Signed)
   Subjective:    Patient ID: Brandon Gibbs, male    DOB: Jun 15, 1976, 38 y.o.   MRN: [id number]  HPI  38 year old white male here for routine physical. Patient planning to participate in [employer] with his 19 year old son. Patient denies any musculoskeletal issues. He does not have any history of diabetes or seizures.  Interval medical history-electronic medical chart reviewed. Patient seen within the last 6 months for STD screening. Patient denies any genitourinary symptoms. He denies any high risk sexual contacts. His previous STD testing was unremarkable  Review of Systems  Constitutional: Negative for activity change, appetite change and unexpected weight change.  Eyes: Negative for visual disturbance.  Respiratory: Negative for cough, chest tightness and shortness of breath.   Cardiovascular: Negative for chest pain.  Genitourinary: Negative for difficulty urinating.  Neurological: Negative for headaches.  Gastrointestinal: Negative for abdominal pain, heartburn melena or hematochezia Psych: Negative for depression or anxiety ID: no night sweats or lymphadenopathy        Past Medical History  Diagnosis Date  . Kidney stones     History   Social History  . Marital Status: Married    Spouse Name: N/A    Number of Children: N/A  . Years of Education: N/A   Occupational History  . diesel Curator [employer]   Social History Main Topics  . Smoking status: Former Smoker    Types: Cigarettes    Quit date: 01/01/2006  . Smokeless tobacco: Never Used  . Alcohol Use: Yes     Comment: occ  . Drug Use: No  . Sexual Activity: Not on file   Other Topics Concern  . 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 He has 2 children daughter 64 and son 8    No past surgical history on file.  No family history on file.  Allergies  Allergen Reactions  . Penicillins     Current Outpatient  Prescriptions on File Prior to Visit  Medication Sig Dispense Refill  . Multiple Vitamin (MULTIVITAMIN) tablet Take 1 tablet by mouth daily.       No current facility-administered medications on file prior to visit.    BP 110/76  Pulse 72  Temp(Src) 98.1 F (36.7 C) (Oral)  Ht 5' 9.25" (1.759 m)  Wt 159 lb (72.122 kg)  BMI 23.31 kg/m2    Objective:   Physical Exam  Constitutional: He is oriented to person, place, and time. He appears well-developed and well-nourished. No distress.  HENT:  Head: Normocephalic and atraumatic.  Eyes: EOM are normal. Pupils are equal, round, and reactive to light. No scleral icterus.  Neck: Neck supple.  Cardiovascular: Normal rate, regular rhythm and intact distal pulses.   No murmur heard. Pulmonary/Chest: Effort normal and breath sounds normal.  Abdominal: Soft. Bowel sounds are normal. He exhibits no mass. There is no tenderness.  Genitourinary: Penis normal.  Normal testicular exam  Musculoskeletal: He exhibits no edema.  Lymphadenopathy:    He has no cervical adenopathy.  Neurological: He is alert and oriented to person, place, and time. No cranial nerve deficit.  Skin: Skin is warm and dry.  Psychiatric: He has a normal mood and affect. His behavior is normal.       Assessment & Plan:

## 2013-12-20 NOTE — Assessment & Plan Note (Signed)
Reviewed adult health maintenance protocols.  Medical clearance to participate for [employer] completed.  He is up-to-date with tetanus vaccine. He declines screening labs.

## 2014-08-01 ENCOUNTER — Encounter: Payer: Self-pay | Admitting: Family Medicine

## 2014-08-01 ENCOUNTER — Ambulatory Visit (INDEPENDENT_AMBULATORY_CARE_PROVIDER_SITE_OTHER): Payer: BC Managed Care – PPO | Admitting: Family Medicine

## 2014-08-01 VITALS — BP 130/80 | HR 78 | Temp 97.8°F | Wt 162.0 lb

## 2014-08-01 DIAGNOSIS — S0552XA Penetrating wound with foreign body of left eyeball, initial encounter: Secondary | ICD-10-CM

## 2014-08-01 DIAGNOSIS — H5712 Ocular pain, left eye: Secondary | ICD-10-CM

## 2014-08-01 NOTE — Progress Notes (Signed)
   Subjective:    Patient ID: Brandon Gibbs, male    DOB: 10/20/1975, 39 y.o.   MRN: [id number]  HPI Left eye pain. Onset yesterday at work. He does work around metal but does not recall any definite foreign body. He has contacts which has kept out since last night. He has moderate to severe left eye pain and noticed some increased watering and diffuse redness today. He did not have any drainage other than the clear watery discharge. He describes a burning sensation. Has not noted any periocular rashes. He has left his contacts out today. He started tobramycin eyedrops last night which were left over from previous prescription.  Past Medical History  Diagnosis Date  . Kidney stones    No past surgical history on file.  reports that he quit smoking about 8 years ago. His smoking use included Cigarettes. He has never used smokeless tobacco. He reports that he drinks alcohol. He reports that he does not use illicit drugs. family history is not on file. Allergies  Allergen Reactions  . Penicillins       Review of Systems  Constitutional: Negative for fever and chills.  Eyes: Positive for photophobia, pain and redness. Negative for discharge, itching and visual disturbance.  Skin: Negative for rash.  Neurological: Negative for dizziness and headaches.       Objective:   Physical Exam  Constitutional: He appears well-developed and well-nourished.  Eyes: Conjunctivae are normal. Pupils are equal, round, and reactive to light.  Left eye is obviously erythematous diffusely. Conjunctiva appears normal. No purulent secretions. He has some diffuse watering of the left eye. He has small defect and possible foreign body noted near the center of the cornea over the pupil. This is confirmed with fluoroscopy seen staining. No evidence for any ulceration or other foreign bodies.  Cardiovascular: Normal rate and regular rhythm.   Pulmonary/Chest: Effort normal and breath sounds normal.           Assessment & Plan:  Left eye pain. He has what appears to be foreign body-? Metallic and/or abrasion. Visual acuity intact. Immediate referral to ophthalmology and we were able to get worked in  this afternoon

## 2014-08-01 NOTE — Progress Notes (Signed)
Pre visit review using our clinic review tool, if applicable. No additional management support is needed unless otherwise documented below in the visit note. 

## 2014-08-21 ENCOUNTER — Encounter: Payer: Self-pay | Admitting: Family Medicine

## 2014-08-21 ENCOUNTER — Ambulatory Visit (INDEPENDENT_AMBULATORY_CARE_PROVIDER_SITE_OTHER): Payer: BC Managed Care – PPO | Admitting: Family Medicine

## 2014-08-21 VITALS — BP 120/80 | Temp 98.5°F | Wt 160.0 lb

## 2014-08-21 DIAGNOSIS — Z202 Contact with and (suspected) exposure to infections with a predominantly sexual mode of transmission: Secondary | ICD-10-CM | POA: Insufficient documentation

## 2014-08-21 NOTE — Progress Notes (Signed)
Pre visit review using our clinic review tool, if applicable. No additional management support is needed unless otherwise documented below in the visit note. 

## 2014-08-21 NOTE — Progress Notes (Signed)
   Subjective:    Patient ID: Brandon Gibbs, male    DOB: 09/23/1975, 39 y.o.   MRN: [id number]  HPI Brandon [name] is a 39 year old single male who comes in today to discuss STDs  He was in contact with a male who was reported to have herpes. However he's not had a herpetic symptoms. He's had ST-T some testing in the past which included syphilis gonorrhea chlamydia and HIV all of which were negative.  He's asymptomatic  We discussed the screening test the pluses and minuses and indications. Patient elects to have a chlamydia screening   Review of Systems Review of systems negative asymptomatic    Objective:   Physical Exam  Well-developed well-nourished male no acute distress vital signs stable he is afebrile and asymptomatic      Assessment & Plan:  STD contact question question....... screen for chlamydia.

## 2014-08-24 LAB — GONOCOCCUS CULTURE: ORGANISM ID, BACTERIA: NO GROWTH

## 2015-12-24 ENCOUNTER — Encounter: Payer: Self-pay | Admitting: Family Medicine

## 2015-12-24 ENCOUNTER — Ambulatory Visit (INDEPENDENT_AMBULATORY_CARE_PROVIDER_SITE_OTHER): Payer: BC Managed Care – PPO | Admitting: Family Medicine

## 2015-12-24 VITALS — BP 120/80 | HR 64 | Temp 98.3°F | Resp 12 | Ht 69.25 in | Wt 151.0 lb

## 2015-12-24 DIAGNOSIS — N529 Male erectile dysfunction, unspecified: Secondary | ICD-10-CM

## 2015-12-24 DIAGNOSIS — R634 Abnormal weight loss: Secondary | ICD-10-CM | POA: Diagnosis not present

## 2015-12-24 DIAGNOSIS — R5382 Chronic fatigue, unspecified: Secondary | ICD-10-CM

## 2015-12-24 DIAGNOSIS — G4700 Insomnia, unspecified: Secondary | ICD-10-CM

## 2015-12-24 MED ORDER — MIRTAZAPINE 15 MG PO TABS: 15.0000 mg | ORAL_TABLET | Freq: Every day | ORAL | Status: DC

## 2015-12-24 NOTE — Progress Notes (Signed)
HPI:   [name] is a 40 y.o. male, who is here today complaining of fatigue, wt loss, and trouble sleeping.  He has noted wt loss, gradual, for the past year, about 30 lb; 10 in the past 1-2 months. He stated eating more frequent in the past month, before he was eating 1-2 meals daily due to poor appetite, which he attributed to stress but he feels like his appetite is fine now, also eating at least 3 meals daily. He feels like he is still losing wt.  He denies any depressed mood or anxiety.  + Fatigue, feeling "exausted" for 6 months or more.  Insomnia: sometimes he has trouble falling asleep or does not sleep at all for 2-3 night; then he sleeps well for 1-2 weeks. He is not aware of sleep apnea. He wonders if he has low testosterone. Has had some difficulty with erections in the past few weeks, not able to sustain or have an erection.  Denies severe/frequent headache, visual changes, chest pain, dyspnea, palpitation, claudication, focal weakness, or edema.  Denies abdominal pain, nausea, vomiting, changes in bowel habits, blood in stool or melena.  No insect/tick bite. No fever, chills, oral lesions,rash, arthralgias, or myalgias. No recent travel.   Former smoker.  No exercising for 2 months due to fatigue.  Denies falling asleep while driving or having a conversation with somebody.  Denies frequent alcohol intake or abuse. No illicit drug use.  He lives with his mother, moved in after his father died. He denies FHx of cancer at early age.   Review of Systems  Constitutional: Positive for activity change and fatigue. Negative for fever, chills, diaphoresis and appetite change.  HENT: Negative for facial swelling, hearing loss, mouth sores, nosebleeds, sore throat and trouble swallowing.   Eyes: Negative for redness and visual disturbance.  Respiratory: Negative for apnea, cough, shortness of breath, wheezing and stridor.   Cardiovascular: Negative  for chest pain, palpitations and leg swelling.  Gastrointestinal: Negative for nausea, vomiting, abdominal pain, diarrhea, constipation and blood in stool.       No changes in bowel habits.  Endocrine: Negative for cold intolerance, heat intolerance, polydipsia, polyphagia and polyuria.  Genitourinary: Negative for dysuria, hematuria, decreased urine volume, discharge, genital sores and testicular pain.  Musculoskeletal: Negative for myalgias, back pain, joint swelling, arthralgias and neck pain.  Skin: Negative for color change and rash.  Neurological: Negative for syncope, speech difficulty, weakness, numbness and headaches.  Hematological: Negative for adenopathy. Does not bruise/bleed easily.  Psychiatric/Behavioral: Positive for sleep disturbance. Negative for hallucinations and behavioral problems. The patient is not nervous/anxious.       Current Outpatient Prescriptions on File Prior to Visit  Medication Sig Dispense Refill  . Multiple Vitamin (MULTIVITAMIN) tablet Take 1 tablet by mouth daily.     No current facility-administered medications on file prior to visit.     Past Medical History  Diagnosis Date  . Kidney stones    Allergies  Allergen Reactions  . Penicillins      Social History   Social History  . Marital Status: Married    Spouse Name: N/A  . Number of Children: N/A  . Years of Education: N/A   Occupational History  . diesel Curator [employer]   Social History Main Topics  . Smoking status: Former Smoker    Types: Cigarettes    Quit date: 01/01/2006  . Smokeless tobacco: Never Used  . Alcohol Use: Yes  Comment: occ  . Drug Use: No  . Sexual Activity: Not Asked   Other Topics Concern  . 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 He has 2 children daughter [id number] and son [id number]    Filed Vitals:   12/24/15 1533  BP: 120/80  Pulse: 64  Temp: 98.3 F (36.8 C)  Resp: 12   Body  mass index is 22.14 kg/(m^2).   Wt Readings from Last 3 Encounters:  12/24/15 151 lb (68.493 kg)  08/21/14 160 lb (72.576 kg)  08/01/14 162 lb (73.483 kg)   SpO2 Readings from Last 3 Encounters:  12/24/15 98%  08/23/13 98%  05/29/13 98%      Physical Exam  Nursing note and vitals reviewed. Constitutional: He is oriented to person, place, and time. He appears well-developed and well-nourished. No distress.  HENT:  Head: Atraumatic.  Mouth/Throat: Uvula is midline, oropharynx is clear and moist and mucous membranes are normal.  Eyes: Conjunctivae and EOM are normal. Pupils are equal, round, and reactive to light. No scleral icterus.  Neck: Normal range of motion. No muscular tenderness present. No thyromegaly present.  Cardiovascular: Normal rate and regular rhythm.   No murmur heard. Pulses:      Dorsalis pedis pulses are 2+ on the right side, and 2+ on the left side.  Respiratory: Effort normal and breath sounds normal. No respiratory distress.  GI: Soft. He exhibits no mass. There is no hepatosplenomegaly. There is no tenderness.  Musculoskeletal: He exhibits no edema.  No signs of synovitis or deformiities appreciated.  Lymphadenopathy:       Head (right side): No submandibular adenopathy present.       Head (left side): No submandibular adenopathy present.    He has no cervical adenopathy.       Right: No supraclavicular adenopathy present.       Left: No supraclavicular adenopathy present.  Neurological: He is alert and oriented to person, place, and time. He has normal strength. No cranial nerve deficit. Coordination and gait normal.  Skin: Skin is warm. No lesion noted. No erythema.  Psychiatric: His speech is normal.  Flat affect,poor groomed, good eye contact.      ASSESSMENT AND PLAN:     Casimiro [name] was seen today for fatigue, weight loss and insomnia.  Diagnoses and all orders for this visit:  Chronic fatigue  Explained that this is a unspecific  symptom, possible causes discussed including systemic illness, psychyatric conditions among some. Poor sleep could be aggravating problem as well as stress. Healthy diet and regular physical activity sometimes help.  Further recommendations will be given accordingly.  -     Testosterone -     Comprehensive metabolic panel -     TSH -     CBC with Differential/Platelet -     Sedimentation Rate  Erectile dysfunction, unspecified erectile dysfunction type  Intermittent. Further recommendations will be given according to lab results. Testosterone ideally needs to be a morning lab but he wants it now while he is here.  -     TSH -     Testosterone  Insomnia, unspecified  Some side effects of Remeron discussed, may help with insomnia and wt gain. Good sleep hygiene. F/U in 2 months.  -     mirtazapine (REMERON) 15 MG tablet; Take 1 tablet (15 mg total) by mouth at bedtime.   Loss of weight  BMI in normal range. It seems to  be related to low food intake. Recommended 3 meals and snacks in between. Some labs done today, further recommendations according to results will be given. F/U in 2 months.      -He was advised to return sooner than planned if symptoms worsen or new concerns arise, he voices understanding.       Weber Monnier G. [place], MD  [facility]. [place] office.

## 2015-12-24 NOTE — Patient Instructions (Signed)
A few things to remember from today's visit:   1. Chronic fatigue  - Testosterone - Comprehensive metabolic panel - TSH - CBC with Differential/Platelet - Sedimentation Rate  2. Erectile dysfunction, unspecified erectile dysfunction type  - Testosterone  3. Insomnia, unspecified  - mirtazapine (REMERON) 15 MG tablet; Take 1 tablet (15 mg total) by mouth at bedtime.  Dispense: 30 tablet; Refill: 1  4. Loss of weight   3 meals daily and snacks in between. Good sleep hygiene.  We have ordered labs or studies at this visit.  It can take up to 1-2 weeks for results and processing. IF results require follow up or explanation, we will call you with instructions. Clinically stable results will be released to your [facility]. If you have not heard from us or cannot find your results in [facility] in 2 weeks please contact our office at [phone].  If you are not yet signed up for [facility], please consider signing up       If you sign-up for My chart, you can communicate easier with us in case you have any question or concern.

## 2015-12-25 LAB — CBC WITH DIFFERENTIAL/PLATELET
Basophils Absolute: 0 10*3/uL (ref 0.0–0.1)
Basophils Relative: 0.4 % (ref 0.0–3.0)
EOS PCT: 2 % (ref 0.0–5.0)
Eosinophils Absolute: 0.1 10*3/uL (ref 0.0–0.7)
HEMATOCRIT: 43.3 % (ref 39.0–52.0)
HEMOGLOBIN: 14.6 g/dL (ref 13.0–17.0)
LYMPHS ABS: 1.2 10*3/uL (ref 0.7–4.0)
LYMPHS PCT: 22.6 % (ref 12.0–46.0)
MCHC: 33.7 g/dL (ref 30.0–36.0)
MCV: 84.5 fl (ref 78.0–100.0)
MONOS PCT: 10 % (ref 3.0–12.0)
Monocytes Absolute: 0.5 10*3/uL (ref 0.1–1.0)
Neutro Abs: 3.4 10*3/uL (ref 1.4–7.7)
Neutrophils Relative %: 65 % (ref 43.0–77.0)
Platelets: 226 10*3/uL (ref 150.0–400.0)
RBC: 5.12 Mil/uL (ref 4.22–5.81)
RDW: 13.2 % (ref 11.5–15.5)
WBC: 5.2 10*3/uL (ref 4.0–10.5)

## 2015-12-25 LAB — COMPREHENSIVE METABOLIC PANEL
ALBUMIN: 4.6 g/dL (ref 3.5–5.2)
ALT: 14 U/L (ref 0–53)
AST: 13 U/L (ref 0–37)
Alkaline Phosphatase: 66 U/L (ref 39–117)
BUN: 13 mg/dL (ref 6–23)
CHLORIDE: 104 meq/L (ref 96–112)
CO2: 29 mEq/L (ref 19–32)
Calcium: 9.8 mg/dL (ref 8.4–10.5)
Creatinine, Ser: 1.05 mg/dL (ref 0.40–1.50)
GFR: 84.13 mL/min (ref >60.00)
Glucose, Bld: 88 mg/dL (ref 70–99)
POTASSIUM: 4.3 meq/L (ref 3.5–5.1)
SODIUM: 140 meq/L (ref 135–145)
Total Bilirubin: 1.3 mg/dL — ABNORMAL HIGH (ref 0.2–1.2)
Total Protein: 7.2 g/dL (ref 6.0–8.3)

## 2015-12-25 LAB — SEDIMENTATION RATE: Sed Rate: 2 mm/hr (ref 0–15)

## 2015-12-25 LAB — TSH: TSH: 1.56 u[IU]/mL (ref 0.35–4.50)

## 2015-12-25 LAB — TESTOSTERONE: Testosterone: 261.95 ng/dL — ABNORMAL LOW (ref 300.00–890.00)

## 2015-12-29 ENCOUNTER — Other Ambulatory Visit: Payer: Self-pay | Admitting: Family Medicine

## 2015-12-29 DIAGNOSIS — R7989 Other specified abnormal findings of blood chemistry: Secondary | ICD-10-CM

## 2015-12-30 ENCOUNTER — Telehealth: Payer: Self-pay

## 2015-12-30 NOTE — Telephone Encounter (Signed)
Called patient. Gave lab results. Patient verbalized understanding.  

## 2015-12-30 NOTE — Telephone Encounter (Signed)
-----   Message from Betty G SwazilandJordan, MD sent at 12/29/2015  2:17 PM EDT ----- Labs otherwise normal except for testosterone. It is mildly under normal range, so as we discussed during last OV we need to repeat it but in the morning. Order placed.  Normal thyroid function, no anemia or blood cell abnormality, renal and liver function otherwise normal.  Thanks!

## 2016-01-01 ENCOUNTER — Other Ambulatory Visit (INDEPENDENT_AMBULATORY_CARE_PROVIDER_SITE_OTHER): Payer: BC Managed Care – PPO

## 2016-01-01 DIAGNOSIS — E291 Testicular hypofunction: Secondary | ICD-10-CM

## 2016-01-01 DIAGNOSIS — R7989 Other specified abnormal findings of blood chemistry: Secondary | ICD-10-CM

## 2016-01-04 ENCOUNTER — Encounter: Payer: Self-pay | Admitting: Internal Medicine

## 2016-02-26 ENCOUNTER — Ambulatory Visit: Payer: BC Managed Care – PPO | Admitting: Family Medicine

## 2016-03-08 ENCOUNTER — Encounter: Payer: Self-pay | Admitting: Family Medicine

## 2016-03-08 ENCOUNTER — Ambulatory Visit (INDEPENDENT_AMBULATORY_CARE_PROVIDER_SITE_OTHER): Payer: BC Managed Care – PPO | Admitting: Family Medicine

## 2016-03-08 VITALS — BP 128/72 | HR 82 | Resp 12 | Ht 69.25 in | Wt 150.5 lb

## 2016-03-08 DIAGNOSIS — E291 Testicular hypofunction: Secondary | ICD-10-CM

## 2016-03-08 DIAGNOSIS — Z79899 Other long term (current) drug therapy: Secondary | ICD-10-CM

## 2016-03-08 DIAGNOSIS — R5382 Chronic fatigue, unspecified: Secondary | ICD-10-CM | POA: Insufficient documentation

## 2016-03-08 DIAGNOSIS — Z79890 Hormone replacement therapy (postmenopausal): Secondary | ICD-10-CM

## 2016-03-08 DIAGNOSIS — R7989 Other specified abnormal findings of blood chemistry: Secondary | ICD-10-CM | POA: Insufficient documentation

## 2016-03-08 DIAGNOSIS — Z5181 Encounter for therapeutic drug level monitoring: Secondary | ICD-10-CM | POA: Diagnosis not present

## 2016-03-08 DIAGNOSIS — G4700 Insomnia, unspecified: Secondary | ICD-10-CM | POA: Diagnosis not present

## 2016-03-08 MED ORDER — TESTOSTERONE 4 MG/24HR TD PT24: 1.0000 | MEDICATED_PATCH | Freq: Every day | TRANSDERMAL | 2 refills | Status: DC

## 2016-03-08 NOTE — Progress Notes (Signed)
HPI:   [name] is a 40 y.o. male, who is here today to follow on last OV, 12/24/15, when she established with me and was complaining about > 6 months of fatigue, decreased appetite, difficulty sleeping, and a couple months of weight loss.  I recommended Remeron 50 mg at bedtime. He is still complaining of weight loss, not sure about how much. He has not changed his eating habits as recommended.  Stopped taking Remeron 10 days after starting it because drowsiness next day.  He is still having trouble falling asleep and staying asleep when he sleeps in his mom's house, he noted that he sleeps well when spending night with girlfriend.  + fatigue, independent of quality of sleep.  He denies loud snoring or sleep apnea. He has 3 jobs but states that he has worked 2-3 jobs at the time for many years. He denies depression or anxiety.  Work-up done 12/2015 otherwise negative except for mildly low testosterone. Last office visit he also reported intermittent ED problems, today he is reporting improvement.  No new symptoms reported.  Lab Results  Component Value Date   TESTOSTERONE 261.95 (L) 12/24/2015    01/01/16 total testosterone repeated: 230 and free testosterone 10.9 (normal lower range).   Review of Systems  Constitutional: Positive for fatigue. Negative for appetite change, chills, diaphoresis and fever.  HENT: Negative for mouth sores, nosebleeds, trouble swallowing and voice change.   Eyes: Negative for pain, redness and visual disturbance.  Respiratory: Negative for apnea, cough, shortness of breath, wheezing and stridor.   Cardiovascular: Negative for chest pain, palpitations and leg swelling.  Gastrointestinal: Negative for abdominal pain, nausea and vomiting.       No changes in bowel habits.  Genitourinary: Negative for decreased urine volume, difficulty urinating, frequency, hematuria and urgency.  Musculoskeletal: Negative for arthralgias and  myalgias.  Skin: Negative for pallor and rash.  Neurological: Negative for syncope, weakness, numbness and headaches.  Psychiatric/Behavioral: Positive for sleep disturbance. Negative for behavioral problems, hallucinations and suicidal ideas. The patient is not nervous/anxious.       Current Outpatient Prescriptions on File Prior to Visit  Medication Sig Dispense Refill  . Multiple Vitamin (MULTIVITAMIN) tablet Take 1 tablet by mouth daily.     No current facility-administered medications on file prior to visit.      Past Medical History:  Diagnosis Date  . Kidney stones    Allergies  Allergen Reactions  . Penicillins     Social History   Social History  . Marital status: Married    Spouse name: N/A  . Number of children: N/A  . Years of education: N/A   Occupational History  . [employer]   Social History Main Topics  . Smoking status: Former Smoker    Types: Cigarettes    Quit date: 01/01/2006  . Smokeless tobacco: Never Used  . Alcohol use Yes     Comment: occ  . Drug use: No  . Sexual activity: Not Asked   Other Topics Concern  . 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 He has 2 children daughter [id number] and son [id number]    Vitals:   03/08/16 1522  BP: 128/72  Pulse: 82  Resp: 12   Body mass index is 22.06 kg/m.   Wt Readings from Last 3 Encounters:  03/08/16 150 lb 8 oz (68.3 kg)  12/24/15 151 lb (68.5  kg)  08/21/14 160 lb (72.6 kg)      Physical Exam  Nursing note and vitals reviewed. Constitutional: He is oriented to person, place, and time. He appears well-developed and well-nourished. No distress.  HENT:  Head: Atraumatic.  Mouth/Throat: Oropharynx is clear and moist and mucous membranes are normal.  Eyes: Conjunctivae and EOM are normal. Pupils are equal, round, and reactive to light.  Neck: Normal range of motion. No muscular tenderness present.    Cardiovascular: Normal rate and regular rhythm.   No murmur heard. Respiratory: Effort normal and breath sounds normal. No respiratory distress.  Lymphadenopathy:    He has no cervical adenopathy.       Right: No supraclavicular adenopathy present.       Left: No supraclavicular adenopathy present.  Neurological: He is alert and oriented to person, place, and time. He has normal strength. Coordination and gait normal.  Skin: Skin is warm. No lesion and no rash noted. No erythema.  Psychiatric: His speech is normal. His mood appears anxious.  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      ASSESSMENT AND PLAN:     Brandon [name] was seen today for follow-up.  Diagnoses and all orders for this visit:  Low serum testosterone  Could explain symptomatology. After discussion of testosterone side effects, he agrees to start testosterone replacement. He would like to try patches, we discussed possible skin rash on patch side, it is helping I instructed filet mignon so we can try topical steroid. Lab work in 2 months. Follow-up in 4 months.  -     testosterone (ANDRODERM) 4 MG/24HR PT24 patch; Place 1 patch onto the skin daily. -     Testosterone; Future  Chronic fatigue  Explained that it is nonspecific, lab work otherwise negative except for testosterone level. He will try testosterone replacement. In regard to weight loss, his weight today is otherwise stable. Follow-up in 4 months.  Insomnia, unspecified  Good sleep hygiene. He can try OTC medication for now. Follow-up in 4 months.   Encounter for monitoring testosterone replacement therapy -     CBC; Future -     Hepatic Function Panel; Future -     PSA; Future -     Testosterone; Future          -Brandon Gibbs was advised to return sooner than planned today if new concerns arise.       Winslow Verrill G. [place], MD  [facility]. [place] office.

## 2016-03-08 NOTE — Progress Notes (Signed)
Pre visit review using our clinic review tool, if applicable. No additional management support is needed unless otherwise documented below in the visit note. 

## 2016-03-08 NOTE — Patient Instructions (Addendum)
A few things to remember from today's visit:   Low serum testosterone - Plan: testosterone (ANDRODERM) 4 MG/24HR PT24 patch  As we discussed symptoms are nonspecific and can be caused by many medical problems. Since testosterone was slightly low, try testosterone patches. Lab in 2 months and follow-up in 4 months.  Please review side effects.   Please be sure medication list is accurate. If a new problem present, please set up appointment sooner than planned today.

## 2016-03-16 ENCOUNTER — Telehealth: Payer: Self-pay | Admitting: Internal Medicine

## 2016-03-16 NOTE — Telephone Encounter (Signed)
Pt following up on prior auth for  testosterone (ANDRODERM) 4 MG/24HR PT24 patch  Written 8/22.  Please advise! Thanks.

## 2016-03-16 NOTE — Telephone Encounter (Signed)
Called and informed patient that the PA is approved through 03/16/2019. Pharmacy is aware & will process as soon as the fax is done sending.

## 2016-05-02 ENCOUNTER — Other Ambulatory Visit (INDEPENDENT_AMBULATORY_CARE_PROVIDER_SITE_OTHER): Payer: BC Managed Care – PPO

## 2016-05-02 DIAGNOSIS — Z5181 Encounter for therapeutic drug level monitoring: Secondary | ICD-10-CM

## 2016-05-02 DIAGNOSIS — Z79899 Other long term (current) drug therapy: Secondary | ICD-10-CM

## 2016-05-02 DIAGNOSIS — R7989 Other specified abnormal findings of blood chemistry: Secondary | ICD-10-CM

## 2016-05-02 DIAGNOSIS — Z79890 Hormone replacement therapy (postmenopausal): Principal | ICD-10-CM

## 2016-05-02 DIAGNOSIS — E291 Testicular hypofunction: Secondary | ICD-10-CM

## 2016-05-02 LAB — CBC
HEMATOCRIT: 44.7 % (ref 39.0–52.0)
HEMOGLOBIN: 15.4 g/dL (ref 13.0–17.0)
MCHC: 34.5 g/dL (ref 30.0–36.0)
MCV: 82.5 fl (ref 78.0–100.0)
PLATELETS: 217 10*3/uL (ref 150.0–400.0)
RBC: 5.42 Mil/uL (ref 4.22–5.81)
RDW: 12.7 % (ref 11.5–15.5)
WBC: 5.7 10*3/uL (ref 4.0–10.5)

## 2016-05-02 LAB — HEPATIC FUNCTION PANEL
ALBUMIN: 5 g/dL (ref 3.5–5.2)
ALK PHOS: 81 U/L (ref 39–117)
ALT: 18 U/L (ref 0–53)
AST: 16 U/L (ref 0–37)
Bilirubin, Direct: 0.2 mg/dL (ref 0.0–0.3)
TOTAL PROTEIN: 7.8 g/dL (ref 6.0–8.3)
Total Bilirubin: 1.2 mg/dL (ref 0.2–1.2)

## 2016-05-02 LAB — TESTOSTERONE: TESTOSTERONE: 217.28 ng/dL — AB (ref 300.00–890.00)

## 2016-05-02 LAB — PSA: PSA: 0.82 ng/mL (ref 0.10–4.00)

## 2016-05-13 ENCOUNTER — Telehealth: Payer: Self-pay | Admitting: Family Medicine

## 2016-05-13 NOTE — Telephone Encounter (Signed)
Called and spoke to patient. Per Dr. Elvis CoilJordan's lab note, she wants him to continue the Androderm and we will re check his testosterone level during his next visit.

## 2016-05-13 NOTE — Telephone Encounter (Signed)
° ° °  Pt said he received his lab results and would like a call back letting him know what the next step is .

## 2016-07-13 ENCOUNTER — Ambulatory Visit (INDEPENDENT_AMBULATORY_CARE_PROVIDER_SITE_OTHER): Payer: BC Managed Care – PPO | Admitting: Family Medicine

## 2016-07-13 ENCOUNTER — Encounter: Payer: Self-pay | Admitting: Family Medicine

## 2016-07-13 ENCOUNTER — Telehealth: Payer: Self-pay

## 2016-07-13 VITALS — BP 120/78 | HR 74 | Resp 12 | Ht 69.25 in | Wt 159.4 lb

## 2016-07-13 DIAGNOSIS — G4700 Insomnia, unspecified: Secondary | ICD-10-CM

## 2016-07-13 DIAGNOSIS — E291 Testicular hypofunction: Secondary | ICD-10-CM | POA: Diagnosis not present

## 2016-07-13 DIAGNOSIS — R5382 Chronic fatigue, unspecified: Secondary | ICD-10-CM | POA: Diagnosis not present

## 2016-07-13 DIAGNOSIS — R7989 Other specified abnormal findings of blood chemistry: Secondary | ICD-10-CM

## 2016-07-13 LAB — HEPATIC FUNCTION PANEL
ALBUMIN: 4.6 g/dL (ref 3.5–5.2)
ALK PHOS: 84 U/L (ref 39–117)
ALT: 19 U/L (ref 0–53)
AST: 14 U/L (ref 0–37)
BILIRUBIN DIRECT: 0.1 mg/dL (ref 0.0–0.3)
TOTAL PROTEIN: 7.2 g/dL (ref 6.0–8.3)
Total Bilirubin: 0.8 mg/dL (ref 0.2–1.2)

## 2016-07-13 LAB — CBC
HCT: 43.5 % (ref 39.0–52.0)
HEMOGLOBIN: 14.8 g/dL (ref 13.0–17.0)
MCHC: 34 g/dL (ref 30.0–36.0)
MCV: 83.4 fl (ref 78.0–100.0)
Platelets: 209 10*3/uL (ref 150.0–400.0)
RBC: 5.22 Mil/uL (ref 4.22–5.81)
RDW: 12.9 % (ref 11.5–15.5)
WBC: 5.8 10*3/uL (ref 4.0–10.5)

## 2016-07-13 LAB — TESTOSTERONE: Testosterone: 234.67 ng/dL — ABNORMAL LOW (ref 300.00–890.00)

## 2016-07-13 MED ORDER — TESTOSTERONE CYPIONATE 200 MG/ML IM SOLN: 200.0000 mg | INTRAMUSCULAR | 0 refills | Status: DC

## 2016-07-13 NOTE — Telephone Encounter (Signed)
Received PA request from [facility] for Testosterone Cypionate. PA submitted & is pending. Key: Gabriel [id number]

## 2016-07-13 NOTE — Progress Notes (Signed)
Pre visit review using our clinic review tool, if applicable. No additional management support is needed unless otherwise documented below in the visit note. 

## 2016-07-13 NOTE — Patient Instructions (Addendum)
A few things to remember from today's visit:   Low serum testosterone - Plan: Testosterone, Hepatic function panel, CBC, testosterone cypionate (DEPOTESTOSTERONE CYPIONATE) 200 MG/ML injection  Chronic fatigue  Insomnia, unspecified type  Will change to injectable testosterone.  These are some of side effects when taking testosterone replacement therapy:  Gum irritation,breast pain and growth (gynecomastia), prostate cancer, enlarged prostate,mood changes, elevated blood pressure and cholesterol, abnormal liver test, swelling, headache,anxiety,and increases risk of strokes and heart attacks among some.    Please be sure medication list is accurate. If a new problem present, please set up appointment sooner than planned today.

## 2016-07-13 NOTE — Progress Notes (Signed)
HPI:   Brandon Gibbs is a 40 y.o. male, who is here today to follow on some of his chronic medical problems.  Hx of low testosterone/hypogonadism, he is on testosterone patch, whojc is causing local dermatitis, he would like to discuss other options in regard to testosterone replacement. He has not noted other side effects.  01/01/16 testosterone 261. 05/02/16 testosterone 217.   Hx of chronic fatigue, he has noted mild improvement since testosterone dose was increased.  Insomnia: He did not tolerate Remeron when recommended for sleep and wt gain. Last OV we discussed sleep hygiene, sleeping better.Turning off TV among others and now sleeping from 4-6 hours.    No new concerns today.     Review of Systems  Constitutional: Positive for fatigue. Negative for appetite change and fever.  Respiratory: Negative for shortness of breath and wheezing.   Cardiovascular: Negative for chest pain, palpitations and leg swelling.  Gastrointestinal: Negative for abdominal pain, nausea and vomiting.       No changes in bowel habits.  Genitourinary: Negative for decreased urine volume, difficulty urinating, dysuria, frequency and hematuria.  Musculoskeletal: Negative for back pain and myalgias.  Skin: Positive for rash.  Neurological: Negative for weakness and headaches.  Psychiatric/Behavioral: Negative for confusion. The patient is not nervous/anxious.       Current Outpatient Prescriptions on File Prior to Visit  Medication Sig Dispense Refill  . Multiple Vitamin (MULTIVITAMIN) tablet Take 1 tablet by mouth daily.     No current facility-administered medications on file prior to visit.      Past Medical History:  Diagnosis Date  . Kidney stones    Allergies  Allergen Reactions  . Penicillins     Social History   Social History  . Marital status: Married    Spouse name: N/A  . Number of children: N/A  . Years of education: N/A   Occupational History    . [employer]   Social History Main Topics  . Smoking status: Former Smoker    Types: Cigarettes    Quit date: 01/01/2006  . Smokeless tobacco: Never Used  . Alcohol use Yes     Comment: occ  . Drug use: No  . Sexual activity: Not Asked   Other Topics Concern  . 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 He has 2 children daughter [id number] and son [id number]    Vitals:   07/13/16 0857  BP: 120/78  Pulse: 74  Resp: 12   Body mass index is 23.37 kg/m.   Wt Readings from Last 3 Encounters:  07/13/16 159 lb 6 oz (72.3 kg)  03/08/16 150 lb 8 oz (68.3 kg)  12/24/15 151 lb (68.5 kg)       Physical Exam  Nursing note and vitals reviewed. Constitutional: He is oriented to person, place, and time. He appears well-developed and well-nourished. No distress.  HENT:  Head: Atraumatic.  Mouth/Throat: Oropharynx is clear and moist and mucous membranes are normal.  Eyes: Conjunctivae and EOM are normal.  Cardiovascular: Normal rate and regular rhythm.   No murmur heard. Respiratory: Effort normal and breath sounds normal. No respiratory distress.  GI: Soft. He exhibits no mass. There is no hepatomegaly. There is no tenderness.  Musculoskeletal: He exhibits no edema or tenderness.  Neurological: He is alert and oriented to person, place, and time. He has normal strength. Coordination and gait normal.  Skin:  Skin is warm. No erythema.  Psychiatric: He has a normal mood and affect. Cognition and memory are normal.  Well groomed, good eye contact.      ASSESSMENT AND PLAN:     Brandon Gibbs was seen today for follow-up.  Diagnoses and all orders for this visit:  Low serum testosterone  Testosterone changed to injectable form,  will follow labs done today and will give further recommendations accordingly. We discussed some of side effects of testosterone. F/U in 3-4 months.  -     Testosterone -     Hepatic  function panel -     CBC -     testosterone cypionate (DEPOTESTOSTERONE CYPIONATE) 200 MG/ML injection; Inject 1 mL (200 mg total) into the muscle every 14 (fourteen) days.  Chronic fatigue  Could be related to low testosterone vs insomnia. Improved.  Insomnia, unspecified type  Improved, continue good sleep hygiene. Follow-up as needed.         -Brandon Gibbs was advised to return sooner than planned today if new concerns arise.       Berton Butrick G. [place], MD  [facility]. [place] office.

## 2016-07-14 NOTE — Telephone Encounter (Signed)
PA approved, called and let pharmacy know. They will fill Rx for patient.

## 2016-07-29 ENCOUNTER — Ambulatory Visit (INDEPENDENT_AMBULATORY_CARE_PROVIDER_SITE_OTHER): Payer: BC Managed Care – PPO | Admitting: *Deleted

## 2016-07-29 DIAGNOSIS — E291 Testicular hypofunction: Secondary | ICD-10-CM

## 2016-07-29 DIAGNOSIS — R7989 Other specified abnormal findings of blood chemistry: Secondary | ICD-10-CM

## 2016-07-29 MED ORDER — TESTOSTERONE CYPIONATE 200 MG/ML IM SOLN
200.0000 mg | Freq: Once | INTRAMUSCULAR | Status: AC
  Administered 2016-07-29: 200 mg via INTRAMUSCULAR

## 2016-08-12 ENCOUNTER — Ambulatory Visit (INDEPENDENT_AMBULATORY_CARE_PROVIDER_SITE_OTHER): Payer: BC Managed Care – PPO

## 2016-08-12 DIAGNOSIS — E291 Testicular hypofunction: Secondary | ICD-10-CM | POA: Diagnosis not present

## 2016-08-12 DIAGNOSIS — R7989 Other specified abnormal findings of blood chemistry: Secondary | ICD-10-CM

## 2016-08-12 MED ORDER — TESTOSTERONE CYPIONATE 200 MG/ML IM SOLN
200.0000 mg | INTRAMUSCULAR | Status: DC
  Administered 2016-08-12: 200 mg via INTRAMUSCULAR

## 2016-08-26 ENCOUNTER — Ambulatory Visit (INDEPENDENT_AMBULATORY_CARE_PROVIDER_SITE_OTHER): Payer: BC Managed Care – PPO

## 2016-08-26 DIAGNOSIS — E291 Testicular hypofunction: Secondary | ICD-10-CM

## 2016-08-26 DIAGNOSIS — R7989 Other specified abnormal findings of blood chemistry: Secondary | ICD-10-CM

## 2016-08-26 MED ORDER — TESTOSTERONE CYPIONATE 200 MG/ML IM SOLN
200.0000 mg | INTRAMUSCULAR | Status: DC
  Administered 2016-08-26: 200 mg via INTRAMUSCULAR

## 2016-08-26 NOTE — Progress Notes (Signed)
Patient received his testosterone injection for low serum testosterone on 08/26/2016 at 1145am. Given by Mendel CorningNancy Danny Yackley CMA.

## 2016-09-09 ENCOUNTER — Ambulatory Visit (INDEPENDENT_AMBULATORY_CARE_PROVIDER_SITE_OTHER): Payer: BC Managed Care – PPO

## 2016-09-09 DIAGNOSIS — E349 Endocrine disorder, unspecified: Secondary | ICD-10-CM | POA: Diagnosis not present

## 2016-09-09 DIAGNOSIS — R7989 Other specified abnormal findings of blood chemistry: Secondary | ICD-10-CM

## 2016-09-09 MED ORDER — TESTOSTERONE CYPIONATE 200 MG/ML IM SOLN
200.0000 mg | INTRAMUSCULAR | Status: DC
  Administered 2016-09-09: 200 mg via INTRAMUSCULAR

## 2016-09-23 ENCOUNTER — Ambulatory Visit (INDEPENDENT_AMBULATORY_CARE_PROVIDER_SITE_OTHER): Payer: BC Managed Care – PPO

## 2016-09-23 DIAGNOSIS — E349 Endocrine disorder, unspecified: Secondary | ICD-10-CM | POA: Diagnosis not present

## 2016-09-23 DIAGNOSIS — R7989 Other specified abnormal findings of blood chemistry: Secondary | ICD-10-CM

## 2016-09-23 MED ORDER — TESTOSTERONE CYPIONATE 200 MG/ML IM SOLN
200.0000 mg | INTRAMUSCULAR | Status: DC
  Administered 2016-09-23: 200 mg via INTRAMUSCULAR

## 2016-09-23 NOTE — Progress Notes (Signed)
Patient got his Testosterone for his low testosterone on 09/23/2016 at 1.10pm. Given by Mendel CorningNancy Kigotho CMA.

## 2016-10-07 ENCOUNTER — Ambulatory Visit (INDEPENDENT_AMBULATORY_CARE_PROVIDER_SITE_OTHER): Payer: BC Managed Care – PPO

## 2016-10-07 DIAGNOSIS — R7989 Other specified abnormal findings of blood chemistry: Secondary | ICD-10-CM

## 2016-10-07 DIAGNOSIS — E291 Testicular hypofunction: Secondary | ICD-10-CM | POA: Diagnosis not present

## 2016-10-07 MED ORDER — TESTOSTERONE CYPIONATE 200 MG/ML IM SOLN
200.0000 mg | INTRAMUSCULAR | Status: DC
  Administered 2016-10-07: 200 mg via INTRAMUSCULAR

## 2016-10-07 NOTE — Progress Notes (Signed)
Patient here for testosterone injection; administered IM to rt hip; patient tolerated well; no s/s of reactions

## 2016-10-24 ENCOUNTER — Ambulatory Visit (INDEPENDENT_AMBULATORY_CARE_PROVIDER_SITE_OTHER): Payer: BC Managed Care – PPO

## 2016-10-24 DIAGNOSIS — R7989 Other specified abnormal findings of blood chemistry: Secondary | ICD-10-CM

## 2016-10-24 DIAGNOSIS — E349 Endocrine disorder, unspecified: Secondary | ICD-10-CM

## 2016-10-24 MED ORDER — TESTOSTERONE CYPIONATE 200 MG/ML IM SOLN
200.0000 mg | INTRAMUSCULAR | Status: DC
  Administered 2016-10-24: 200 mg via INTRAMUSCULAR

## 2016-11-04 ENCOUNTER — Ambulatory Visit (INDEPENDENT_AMBULATORY_CARE_PROVIDER_SITE_OTHER): Payer: BC Managed Care – PPO

## 2016-11-04 DIAGNOSIS — R7989 Other specified abnormal findings of blood chemistry: Secondary | ICD-10-CM

## 2016-11-04 MED ORDER — TESTOSTERONE CYPIONATE 200 MG/ML IM SOLN
200.0000 mg | INTRAMUSCULAR | Status: DC
  Administered 2016-11-04: 200 mg via INTRAMUSCULAR

## 2016-11-11 ENCOUNTER — Ambulatory Visit: Payer: BC Managed Care – PPO | Admitting: Family Medicine

## 2016-11-14 NOTE — Progress Notes (Signed)
HPI:   [name] is a 41 y.o. male, who is here today to follow on some chronic medical problems.  He was last seen on 07/13/17.  Low testosterone/hypogonadism: He is currently on testosterone replacement. After not improvement with topical testosterone he was started on Testosterone Cypionate 200 mg IM q 2 weeks. He has tolerated medication well. Insomnia,fatigue,and episodic ED have resolved. Reports feeling "great."  In regard to his mood, he reports being more temperamental.He gets irritated/upset easier,mainly at work if somebody makes a mistake. He denies feeling aggressive or being physical/verbally abuser.    Lab Results  Component Value Date   TESTOSTERONE 234.67 (L) 07/13/2016   He denies abdominal pain, gross hematuria, urine obstruction, urgency,or increased frequency.   Concerns today:   Today he is c/o louder snoring for the past couple months. According to patient, his girlfriend has been complaining of loud snoring, interfering with her sleep. He has tried nasal strips but they didn't seem to help. Also reporting episodes of sleep apnea weaknessed by his girlfriend, usually exacerbated by alcohol intake before bedtime. He is feeling rested when he first wakes up In the morning and denies any morning headaches. According to patient, he has snored in the past but he seems to be louder than usual.    Review of Systems  Constitutional: Negative for activity change, appetite change, fatigue, fever and unexpected weight change.  HENT: Negative for mouth sores, nosebleeds, sore throat and trouble swallowing.   Eyes: Negative for pain and visual disturbance.  Respiratory: Positive for apnea. Negative for shortness of breath and wheezing.   Cardiovascular: Negative for chest pain, palpitations and leg swelling.  Gastrointestinal: Negative for abdominal pain, nausea and vomiting.       No changes in bowel habits.  Genitourinary: Negative for  decreased urine volume, difficulty urinating and hematuria.  Skin: Negative for pallor and rash.  Allergic/Immunologic: Negative for environmental allergies.  Neurological: Negative for syncope, weakness and headaches.  Psychiatric/Behavioral: Negative for confusion, hallucinations, sleep disturbance and suicidal ideas. The patient is not nervous/anxious.       Current Outpatient Prescriptions on File Prior to Visit  Medication Sig Dispense Refill  . Multiple Vitamin (MULTIVITAMIN) tablet Take 1 tablet by mouth daily.    Marland Kitchen testosterone cypionate (DEPOTESTOSTERONE CYPIONATE) 200 MG/ML injection Inject 1 mL (200 mg total) into the muscle every 14 (fourteen) days. 10 mL 0   Current Facility-Administered Medications on File Prior to Visit  Medication Dose Route Frequency Provider Last Rate Last Dose  . testosterone cypionate (DEPOTESTOSTERONE CYPIONATE) injection 200 mg  200 mg Intramuscular Q14 Days Dequincy Born G [place], MD   200 mg at 08/12/16 1459  . testosterone cypionate (DEPOTESTOSTERONE CYPIONATE) injection 200 mg  200 mg Intramuscular Q14 Days Angelika Jerrett G [place], MD   200 mg at 08/26/16 1155  . testosterone cypionate (DEPOTESTOSTERONE CYPIONATE) injection 200 mg  200 mg Intramuscular Q14 Days Rashanda Magloire G [place], MD   200 mg at 09/09/16 1318  . testosterone cypionate (DEPOTESTOSTERONE CYPIONATE) injection 200 mg  200 mg Intramuscular Q14 Days Cornelia Walraven G [place], MD   200 mg at 09/23/16 1321  . testosterone cypionate (DEPOTESTOSTERONE CYPIONATE) injection 200 mg  200 mg Intramuscular Q14 Days Trenia Tennyson G [place], MD   200 mg at 10/07/16 1245  . testosterone cypionate (DEPOTESTOSTERONE CYPIONATE) injection 200 mg  200 mg Intramuscular Q14 Days Mclean Moya G [place], MD   200 mg at 10/24/16 1204  . testosterone cypionate (DEPOTESTOSTERONE CYPIONATE) injection 200 mg  200 mg Intramuscular Q14 Days Kristian Covey, MD   200 mg at 11/04/16 1156     Past Medical History:  Diagnosis Date  . Kidney stones    Allergies    Allergen Reactions  . Penicillins     Social History   Social History  . Marital status: Married    Spouse name: N/A  . Number of children: N/A  . Years of education: N/A   Occupational History  . diesel Curator [employer]   Social History Main Topics  . Smoking status: Former Smoker    Types: Cigarettes    Quit date: 01/01/2006  . Smokeless tobacco: Never Used  . Alcohol use Yes     Comment: occ  . Drug use: No  . Sexual activity: Not Asked   Other Topics Concern  . None   Social History Narrative   Production assistant, radio for different county   He is divorced. He has 2 children daughter 38 and son 60    Vitals:   11/15/16 1123  BP: 128/80  Pulse: 86  Resp: 12  O2 sat at RA 98% Body mass index is 23.09 kg/m.   Physical Exam  Nursing note and vitals reviewed. Constitutional: He is oriented to person, place, and time. He appears well-developed and well-nourished. No distress.  HENT:  Head: Atraumatic.  Mouth/Throat: Oropharynx is clear and moist and mucous membranes are normal.  Eyes: Conjunctivae and EOM are normal. Pupils are equal, round, and reactive to light.  Cardiovascular: Normal rate and regular rhythm.   No murmur heard. Respiratory: Effort normal and breath sounds normal. No respiratory distress.  GI: Soft. He exhibits no mass. There is no hepatomegaly. There is no tenderness.  Musculoskeletal: He exhibits no edema or tenderness.  Lymphadenopathy:    He has no cervical adenopathy.  Neurological: He is alert and oriented to person, place, and time. He has normal strength. Coordination and gait normal.  Skin: Skin is warm. No erythema.  Psychiatric: He has a normal mood and affect. His speech is normal.  Well groomed, good eye contact.    ASSESSMENT AND PLAN:   Brandon Gibbs was seen today for follow-up.  Diagnoses and all orders for this visit:  Sleep apnea, unspecified type  Witnessed by his girlfriend and louder snoring, ?  OSA. Consultation with pulmonology will be arranged.  -     Ambulatory referral to Pulmonology  Hypogonadism in male  Clinically improved with testosterone replacement. Declined he has tolerated medication well, explained that irritability could be a side effect from the medication, if worsening we may need to decrease testosterone dose. We discussed other side effects. He will come back tomorrow to recheck testosterone level.  -     Testosterone; Future  Chronic fatigue  Resolved.   Encounter for medication monitoring -     CBC with Differential/Platelet; Future -     Hepatic function panel; Future      -Mr. Brandon Gibbs was advised to return sooner than planned today if new concerns arise.       Janetta Vandoren G. [place], MD  [facility]. [place] office.

## 2016-11-15 ENCOUNTER — Encounter: Payer: Self-pay | Admitting: Family Medicine

## 2016-11-15 ENCOUNTER — Ambulatory Visit (INDEPENDENT_AMBULATORY_CARE_PROVIDER_SITE_OTHER): Payer: BC Managed Care – PPO | Admitting: Family Medicine

## 2016-11-15 VITALS — BP 128/80 | HR 86 | Resp 12 | Ht 69.25 in | Wt 157.5 lb

## 2016-11-15 DIAGNOSIS — G4730 Sleep apnea, unspecified: Secondary | ICD-10-CM

## 2016-11-15 DIAGNOSIS — Z5181 Encounter for therapeutic drug level monitoring: Secondary | ICD-10-CM | POA: Diagnosis not present

## 2016-11-15 DIAGNOSIS — E291 Testicular hypofunction: Secondary | ICD-10-CM

## 2016-11-15 DIAGNOSIS — R5382 Chronic fatigue, unspecified: Secondary | ICD-10-CM | POA: Diagnosis not present

## 2016-11-15 NOTE — Patient Instructions (Signed)
A few things to remember from today's visit:   Low serum testosterone - Plan: Testosterone  Chronic fatigue  Sleep apnea, unspecified type - Plan: Ambulatory referral to Pulmonology  Encounter for medication monitoring - Plan: CBC with Differential/Platelet, Hepatic function panel   Please be sure medication list is accurate. If a new problem present, please set up appointment sooner than planned today.

## 2016-11-15 NOTE — Progress Notes (Signed)
Pre visit review using our clinic review tool, if applicable. No additional management support is needed unless otherwise documented below in the visit note. 

## 2016-11-16 ENCOUNTER — Other Ambulatory Visit (INDEPENDENT_AMBULATORY_CARE_PROVIDER_SITE_OTHER): Payer: BC Managed Care – PPO

## 2016-11-16 DIAGNOSIS — E291 Testicular hypofunction: Secondary | ICD-10-CM

## 2016-11-16 DIAGNOSIS — Z5181 Encounter for therapeutic drug level monitoring: Secondary | ICD-10-CM | POA: Diagnosis not present

## 2016-11-16 LAB — HEPATIC FUNCTION PANEL
ALK PHOS: 65 U/L (ref 39–117)
ALT: 37 U/L (ref 0–53)
AST: 28 U/L (ref 0–37)
Albumin: 4.4 g/dL (ref 3.5–5.2)
BILIRUBIN DIRECT: 0.2 mg/dL (ref 0.0–0.3)
BILIRUBIN TOTAL: 1.5 mg/dL — AB (ref 0.2–1.2)
Total Protein: 7 g/dL (ref 6.0–8.3)

## 2016-11-16 LAB — CBC WITH DIFFERENTIAL/PLATELET
Basophils Absolute: 0 10*3/uL (ref 0.0–0.1)
Basophils Relative: 0.7 % (ref 0.0–3.0)
EOS ABS: 0.1 10*3/uL (ref 0.0–0.7)
Eosinophils Relative: 1.6 % (ref 0.0–5.0)
HCT: 46 % (ref 39.0–52.0)
Hemoglobin: 15.3 g/dL (ref 13.0–17.0)
LYMPHS ABS: 1.2 10*3/uL (ref 0.7–4.0)
Lymphocytes Relative: 22 % (ref 12.0–46.0)
MCHC: 33.2 g/dL (ref 30.0–36.0)
MCV: 85.1 fl (ref 78.0–100.0)
MONO ABS: 0.5 10*3/uL (ref 0.1–1.0)
Monocytes Relative: 10.1 % (ref 3.0–12.0)
NEUTROS PCT: 65.6 % (ref 43.0–77.0)
Neutro Abs: 3.5 10*3/uL (ref 1.4–7.7)
Platelets: 207 10*3/uL (ref 150.0–400.0)
RBC: 5.4 Mil/uL (ref 4.22–5.81)
RDW: 13.5 % (ref 11.5–15.5)
WBC: 5.3 10*3/uL (ref 4.0–10.5)

## 2016-11-16 LAB — TESTOSTERONE: TESTOSTERONE: 289.98 ng/dL — AB (ref 300.00–890.00)

## 2016-11-18 ENCOUNTER — Ambulatory Visit (INDEPENDENT_AMBULATORY_CARE_PROVIDER_SITE_OTHER): Payer: BC Managed Care – PPO | Admitting: *Deleted

## 2016-11-18 DIAGNOSIS — R7989 Other specified abnormal findings of blood chemistry: Secondary | ICD-10-CM

## 2016-11-18 MED ORDER — TESTOSTERONE CYPIONATE 200 MG/ML IM SOLN
200.0000 mg | Freq: Once | INTRAMUSCULAR | Status: AC
  Administered 2016-11-18: 200 mg via INTRAMUSCULAR

## 2016-11-18 NOTE — Progress Notes (Signed)
Patient here for testosterone injection; injection given to left hip IM; patient tolerated well; no s/s of reactions.

## 2016-12-01 ENCOUNTER — Other Ambulatory Visit: Payer: Self-pay | Admitting: Family Medicine

## 2016-12-01 DIAGNOSIS — R7989 Other specified abnormal findings of blood chemistry: Secondary | ICD-10-CM

## 2016-12-01 NOTE — Telephone Encounter (Signed)
Okay to refill? 

## 2016-12-02 NOTE — Telephone Encounter (Signed)
Rx faxed

## 2016-12-09 ENCOUNTER — Ambulatory Visit (INDEPENDENT_AMBULATORY_CARE_PROVIDER_SITE_OTHER): Payer: BC Managed Care – PPO

## 2016-12-09 DIAGNOSIS — R7989 Other specified abnormal findings of blood chemistry: Secondary | ICD-10-CM

## 2016-12-09 MED ORDER — TESTOSTERONE CYPIONATE 200 MG/ML IM SOLN
200.0000 mg | INTRAMUSCULAR | Status: DC
  Administered 2016-12-09: 200 mg via INTRAMUSCULAR

## 2016-12-27 ENCOUNTER — Ambulatory Visit (INDEPENDENT_AMBULATORY_CARE_PROVIDER_SITE_OTHER): Payer: BC Managed Care – PPO

## 2016-12-27 DIAGNOSIS — R7989 Other specified abnormal findings of blood chemistry: Secondary | ICD-10-CM

## 2016-12-27 MED ORDER — TESTOSTERONE CYPIONATE 200 MG/ML IM SOLN
200.0000 mg | INTRAMUSCULAR | Status: DC
  Administered 2016-12-27: 200 mg via INTRAMUSCULAR

## 2017-01-10 ENCOUNTER — Ambulatory Visit (INDEPENDENT_AMBULATORY_CARE_PROVIDER_SITE_OTHER): Payer: BC Managed Care – PPO

## 2017-01-10 DIAGNOSIS — R7989 Other specified abnormal findings of blood chemistry: Secondary | ICD-10-CM

## 2017-01-10 MED ORDER — TESTOSTERONE CYPIONATE 200 MG/ML IM SOLN
200.0000 mg | INTRAMUSCULAR | Status: DC
  Administered 2017-01-10: 200 mg via INTRAMUSCULAR

## 2017-01-24 ENCOUNTER — Ambulatory Visit (INDEPENDENT_AMBULATORY_CARE_PROVIDER_SITE_OTHER): Payer: BC Managed Care – PPO

## 2017-01-24 DIAGNOSIS — R7989 Other specified abnormal findings of blood chemistry: Secondary | ICD-10-CM | POA: Diagnosis not present

## 2017-01-24 MED ORDER — TESTOSTERONE CYPIONATE 200 MG/ML IM SOLN
200.0000 mg | INTRAMUSCULAR | Status: DC
  Administered 2017-01-24: 200 mg via INTRAMUSCULAR

## 2017-02-17 ENCOUNTER — Ambulatory Visit (INDEPENDENT_AMBULATORY_CARE_PROVIDER_SITE_OTHER): Payer: BC Managed Care – PPO | Admitting: Emergency Medicine

## 2017-02-17 DIAGNOSIS — R7989 Other specified abnormal findings of blood chemistry: Secondary | ICD-10-CM | POA: Diagnosis not present

## 2017-02-17 MED ORDER — TESTOSTERONE CYPIONATE 200 MG/ML IM SOLN
200.0000 mg | Freq: Once | INTRAMUSCULAR | Status: AC
  Administered 2017-02-17: 200 mg via INTRAMUSCULAR

## 2017-03-15 ENCOUNTER — Ambulatory Visit (INDEPENDENT_AMBULATORY_CARE_PROVIDER_SITE_OTHER): Payer: BC Managed Care – PPO

## 2017-03-15 DIAGNOSIS — R7989 Other specified abnormal findings of blood chemistry: Secondary | ICD-10-CM | POA: Diagnosis not present

## 2017-03-15 MED ORDER — TESTOSTERONE CYPIONATE 200 MG/ML IM SOLN
200.0000 mg | Freq: Once | INTRAMUSCULAR | Status: AC
  Administered 2017-03-15: 200 mg via INTRAMUSCULAR

## 2017-03-15 NOTE — Progress Notes (Signed)
Pt came in for his testosterone injection. Pt tolerated his injection well 

## 2017-03-26 ENCOUNTER — Other Ambulatory Visit: Payer: Self-pay | Admitting: Family Medicine

## 2017-03-26 DIAGNOSIS — R7989 Other specified abnormal findings of blood chemistry: Secondary | ICD-10-CM

## 2017-03-29 NOTE — Telephone Encounter (Signed)
Rx faxed in.

## 2017-04-06 ENCOUNTER — Ambulatory Visit (INDEPENDENT_AMBULATORY_CARE_PROVIDER_SITE_OTHER): Payer: BC Managed Care – PPO

## 2017-04-06 DIAGNOSIS — R7989 Other specified abnormal findings of blood chemistry: Secondary | ICD-10-CM

## 2017-04-06 MED ORDER — TESTOSTERONE CYPIONATE 200 MG/ML IM SOLN
200.0000 mg | INTRAMUSCULAR | Status: DC
  Administered 2017-04-06: 200 mg via INTRAMUSCULAR

## 2017-05-12 ENCOUNTER — Ambulatory Visit (INDEPENDENT_AMBULATORY_CARE_PROVIDER_SITE_OTHER): Payer: BC Managed Care – PPO

## 2017-05-12 DIAGNOSIS — R7989 Other specified abnormal findings of blood chemistry: Secondary | ICD-10-CM | POA: Diagnosis not present

## 2017-05-12 MED ORDER — TESTOSTERONE CYPIONATE 200 MG/ML IM SOLN
200.0000 mg | Freq: Once | INTRAMUSCULAR | Status: AC
  Administered 2017-05-12: 200 mg via INTRAMUSCULAR

## 2017-07-21 ENCOUNTER — Ambulatory Visit (INDEPENDENT_AMBULATORY_CARE_PROVIDER_SITE_OTHER): Payer: BC Managed Care – PPO

## 2017-07-21 DIAGNOSIS — R7989 Other specified abnormal findings of blood chemistry: Secondary | ICD-10-CM | POA: Diagnosis not present

## 2017-07-21 MED ORDER — TESTOSTERONE CYPIONATE 200 MG/ML IM SOLN
200.0000 mg | Freq: Once | INTRAMUSCULAR | Status: AC
  Administered 2017-07-21: 200 mg via INTRAMUSCULAR

## 2017-08-04 ENCOUNTER — Ambulatory Visit (INDEPENDENT_AMBULATORY_CARE_PROVIDER_SITE_OTHER): Payer: BC Managed Care – PPO | Admitting: Family Medicine

## 2017-08-04 DIAGNOSIS — E349 Endocrine disorder, unspecified: Secondary | ICD-10-CM

## 2017-08-04 MED ORDER — TESTOSTERONE CYPIONATE 200 MG/ML IM SOLN
200.0000 mg | Freq: Once | INTRAMUSCULAR | Status: AC
  Administered 2017-08-04: 200 mg via INTRAMUSCULAR

## 2017-08-08 NOTE — Progress Notes (Signed)
Per orders of Dr. Swazil[place]Jordan, injection of testosterone given by Annice PihPatricia Jaimes, LPN.

## 2017-08-28 ENCOUNTER — Encounter: Payer: Self-pay | Admitting: Family Medicine

## 2017-08-28 ENCOUNTER — Ambulatory Visit: Payer: BC Managed Care – PPO | Admitting: Family Medicine

## 2017-08-28 VITALS — BP 118/76 | HR 67 | Temp 98.5°F | Resp 12 | Ht 69.25 in | Wt 168.1 lb

## 2017-08-28 DIAGNOSIS — R310 Gross hematuria: Secondary | ICD-10-CM | POA: Diagnosis not present

## 2017-08-28 DIAGNOSIS — N200 Calculus of kidney: Secondary | ICD-10-CM | POA: Diagnosis not present

## 2017-08-28 DIAGNOSIS — R7989 Other specified abnormal findings of blood chemistry: Secondary | ICD-10-CM | POA: Diagnosis not present

## 2017-08-28 LAB — BASIC METABOLIC PANEL
BUN: 17 mg/dL (ref 6–23)
CALCIUM: 9.8 mg/dL (ref 8.4–10.5)
CO2: 28 meq/L (ref 19–32)
Chloride: 102 mEq/L (ref 96–112)
Creatinine, Ser: 0.9 mg/dL (ref 0.40–1.50)
GFR: 99.62 mL/min (ref >60.00)
Glucose, Bld: 85 mg/dL (ref 70–99)
Potassium: 4.3 mEq/L (ref 3.5–5.1)
SODIUM: 139 meq/L (ref 135–145)

## 2017-08-28 LAB — POCT URINALYSIS DIPSTICK
Bilirubin, UA: NEGATIVE
Glucose, UA: NEGATIVE
Ketones, UA: NEGATIVE
Leukocytes, UA: NEGATIVE
Nitrite, UA: NEGATIVE
PROTEIN UA: NEGATIVE
SPEC GRAV UA: 1.025 (ref 1.010–1.025)
Urobilinogen, UA: 0.2 E.U./dL
pH, UA: 6 (ref 5.0–8.0)

## 2017-08-28 LAB — TESTOSTERONE: Testosterone: 215.14 ng/dL — ABNORMAL LOW (ref 300.00–890.00)

## 2017-08-28 LAB — CBC
HCT: 46.3 % (ref 38.5–50.0)
HEMOGLOBIN: 15.9 g/dL (ref 13.2–17.1)
MCH: 28.2 pg (ref 27.0–33.0)
MCHC: 34.3 g/dL (ref 32.0–36.0)
MCV: 82.1 fL (ref 80.0–100.0)
MPV: 11.4 fL (ref 7.5–12.5)
Platelets: 240 10*3/uL (ref 140–400)
RBC: 5.64 10*6/uL (ref 4.20–5.80)
RDW: 12.3 % (ref 11.0–15.0)
WBC: 5.2 10*3/uL (ref 3.8–10.8)

## 2017-08-28 NOTE — Assessment & Plan Note (Signed)
This could certainly cause hematuria but he is not having other symptoms associated. Adequate hydration recommended.

## 2017-08-28 NOTE — Assessment & Plan Note (Signed)
No changes in current management, will follow labs done today and will give further recommendations accordingly. We reviewed some side effects of testosterone replacement. F/U in 6 months.

## 2017-08-28 NOTE — Patient Instructions (Addendum)
A few things to remember from today's visit:   Gross hematuria - Plan: POCT urinalysis dipstick, Testosterone, Ambulatory referral to Urology, CBC  Low serum testosterone - Plan: Basic metabolic panel  Urine pending,I am recommending urology evaluation.  Hematuria, Adult Hematuria is blood in your urine. It can be caused by a bladder infection, kidney infection, prostate infection, kidney stone, or cancer of your urinary tract. Infections can usually be treated with medicine, and a kidney stone usually will pass through your urine. If neither of these is the cause of your hematuria, further workup to find out the reason may be needed. It is very important that you tell your health care provider about any blood you see in your urine, even if the blood stops without treatment or happens without causing pain. Blood in your urine that happens and then stops and then happens again can be a symptom of a very serious condition. Also, pain is not a symptom in the initial stages of many urinary cancers. Follow these instructions at home:  Drink lots of fluid, 3-4 quarts a day. If you have been diagnosed with an infection, cranberry juice is especially recommended, in addition to large amounts of water.  Avoid caffeine, tea, and carbonated beverages because they tend to irritate the bladder.  Avoid alcohol because it may irritate the prostate.  Take all medicines as directed by your health care provider.  If you were prescribed an antibiotic medicine, finish it all even if you start to feel better.  If you have been diagnosed with a kidney stone, follow your health care provider's instructions regarding straining your urine to catch the stone.  Empty your bladder often. Avoid holding urine for long periods of time.  After a bowel movement, women should cleanse front to back. Use each tissue only once.  Empty your bladder before and after sexual intercourse if you are a male. Contact a health  care provider if:  You develop back pain.  You have a fever.  You have a feeling of sickness in your stomach (nausea) or vomiting.  Your symptoms are not better in 3 days. Return sooner if you are getting worse. Get help right away if:  You develop severe vomiting and are unable to keep the medicine down.  You develop severe back or abdominal pain despite taking your medicines.  You begin passing a large amount of blood or clots in your urine.  You feel extremely weak or faint, or you pass out. This information is not intended to replace advice given to you by your health care provider. Make sure you discuss any questions you have with your health care provider. Document Released: 07/04/2005 Document Revised: 12/10/2015 Document Reviewed: 03/04/2013 Elsevier Interactive Patient Education  2017 Elsevier Inc.   Please be sure medication list is accurate. If a new problem present, please set up appointment sooner than planned today.

## 2017-08-28 NOTE — Progress Notes (Signed)
ACUTE VISIT   HPI:  Chief Complaint  Patient presents with  . Hematuria    started 3 pm Friday, looked like blood clots were coming out in urine    [name] is a 42 y.o. male, who is here today complaining of gross hematuria that started 3 days ago,last episode was yesterday. A few times he noted blood clots. He has not identified exacerbating or alleviating factors.  Denies dysuria,increased urinary frequency, or decreased urine output.  No prior Hx. Former smoker. No Hx of trauma.  No easy bruising,gum or nose bleed. No abnormal wt loss.  Remote Hx of nephrolithiasis. He has not had abdominal pain,back pain,nasuea,vomiting,or changes in bowel habits.  Hx of hypogonadism,he is on testosterone replacement. Last follow up in 10/2016. He has not noted side effects with testosterone injections. Testosterone replacement has helped with fatigue.    Review of Systems  Constitutional: Negative for appetite change, fatigue and fever.  HENT: Negative for mouth sores, nosebleeds, sore throat and trouble swallowing.   Respiratory: Negative for shortness of breath and wheezing.   Cardiovascular: Negative for chest pain, palpitations and leg swelling.  Gastrointestinal: Negative for abdominal pain, blood in stool, nausea and vomiting.       No changes in bowel habits.  Genitourinary: Positive for hematuria. Negative for decreased urine volume, discharge, dysuria, flank pain, frequency, scrotal swelling and urgency.  Musculoskeletal: Negative for back pain and myalgias.  Skin: Negative for rash and wound.  Neurological: Negative for syncope, weakness and headaches.  Hematological: Negative for adenopathy. Does not bruise/bleed easily.  Psychiatric/Behavioral: Negative for confusion.      Current Outpatient Medications on File Prior to Visit  Medication Sig Dispense Refill  . Multiple Vitamin (MULTIVITAMIN) tablet Take 1 tablet by mouth daily.    Marland Kitchen  testosterone cypionate (DEPOTESTOSTERONE CYPIONATE) 200 MG/ML injection INJECT 1 ML INTO THE MUSCLE EVERY 14 DAYS  1   No current facility-administered medications on file prior to visit.      Past Medical History:  Diagnosis Date  . Kidney stones    Allergies  Allergen Reactions  . Penicillins     Social History   Socioeconomic History  . Marital status: Married    Spouse name: None  . Number of children: None  . Years of education: None  . Highest education level: None  Social Needs  . Financial resource strain: None  . Food insecurity - worry: None  . Food insecurity - inability: None  . Transportation needs - medical: None  . Transportation needs - non-medical: None  Occupational History  . Occupation: Banker: [employer]  Tobacco Use  . Smoking status: Former Smoker    Types: Cigarettes    Last attempt to quit: 01/01/2006    Years since quitting: 11.6  . Smokeless tobacco: Never Used  Substance and Sexual Activity  . Alcohol use: Yes    Comment: occ  . Drug use: No  . Sexual activity: None  Other Topics Concern  . None  Social History Narrative   Production assistant, radio for different county   He is divorced. He has 2 children daughter 59 and son 44    Vitals:   08/28/17 1135  BP: 118/76  Pulse: 67  Resp: 12  Temp: 98.5 F (36.9 C)  SpO2: 98%   Body mass index is 24.65 kg/m.   Physical Exam  Nursing note and vitals reviewed. Constitutional: He is oriented to person, place,  and time. He appears well-developed and well-nourished. No distress.  HENT:  Head: Normocephalic and atraumatic.  Mouth/Throat: Oropharynx is clear and moist and mucous membranes are normal.  Eyes: Conjunctivae are normal. Pupils are equal, round, and reactive to light.  Cardiovascular: Normal rate and regular rhythm.  No murmur heard. Pulses:      Dorsalis pedis pulses are 2+ on the right side, and 2+ on the left side.  Respiratory: Effort  normal and breath sounds normal. No respiratory distress.  GI: Soft. He exhibits no mass. There is no hepatomegaly. There is no tenderness. There is no CVA tenderness.  Musculoskeletal: He exhibits no edema or tenderness.  Lymphadenopathy:    He has no cervical adenopathy.  Neurological: He is alert and oriented to person, place, and time. He has normal strength.  Skin: Skin is warm. No erythema.  Psychiatric: He has a normal mood and affect. Cognition and memory are normal.  Well groomed, good eye contact.      ASSESSMENT AND PLAN:   Mr. Brandon Gibbs was seen today for hematuria.  Orders Placed This Encounter  Procedures  . Urine culture  . Basic metabolic panel  . Testosterone  . CBC  . Ambulatory referral to Urology  . POCT urinalysis dipstick     Gross hematuria  Possible causes discussed, including kidney stone,cystitis,and malignancy among some. Instructed about warning signs. Urine dipstick 1+ blood. Referral to urologist recommended.   -     POCT urinalysis dipstick -     Ambulatory referral to Urology -     CBC   Nephrolithiasis This could certainly cause hematuria but he is not having other symptoms associated. Adequate hydration recommended.  Low serum testosterone No changes in current management, will follow labs done today and will give further recommendations accordingly. We reviewed some side effects of testosterone replacement. F/U in 6 months.    Return in about 6 months (around 02/25/2018) for testosterone.     -Brandon Gibbs was advised to seek immediate medical attention if sudden worsening symptoms or to follow if they persist or if new concerns arise.       Tawnya Pujol G. [place], MD  [facility]. [place] office.

## 2017-08-29 LAB — URINE CULTURE
MICRO NUMBER:: 90180028
Result:: NO GROWTH
SPECIMEN QUALITY:: ADEQUATE

## 2017-09-01 ENCOUNTER — Ambulatory Visit (INDEPENDENT_AMBULATORY_CARE_PROVIDER_SITE_OTHER): Payer: BC Managed Care – PPO

## 2017-09-01 DIAGNOSIS — R7989 Other specified abnormal findings of blood chemistry: Secondary | ICD-10-CM

## 2017-09-01 MED ORDER — TESTOSTERONE CYPIONATE 200 MG/ML IM SOLN
200.0000 mg | INTRAMUSCULAR | Status: AC
Start: 2017-09-01 — End: ?
  Administered 2017-09-01: 200 mg via INTRAMUSCULAR

## 2017-09-01 NOTE — Progress Notes (Signed)
Per orders of Dr Swazil[place]Jordan, injection of Testosterone 200 mg given by Carola RhineNancy N Kigotho. Patient tolerated injection well.

## 2021-09-13 DIAGNOSIS — J40 Bronchitis, not specified as acute or chronic: Secondary | ICD-10-CM | POA: Diagnosis not present

## 2021-12-13 DIAGNOSIS — J40 Bronchitis, not specified as acute or chronic: Secondary | ICD-10-CM | POA: Diagnosis not present

## 2022-02-28 DIAGNOSIS — W228XXA Striking against or struck by other objects, initial encounter: Secondary | ICD-10-CM | POA: Diagnosis not present

## 2022-02-28 DIAGNOSIS — Z23 Encounter for immunization: Secondary | ICD-10-CM | POA: Diagnosis not present

## 2022-02-28 DIAGNOSIS — S61411A Laceration without foreign body of right hand, initial encounter: Secondary | ICD-10-CM | POA: Diagnosis not present
